# Patient Record
Sex: Female | Born: 1995 | Race: White | Hispanic: No | Smoking: Never smoker
Health system: Southern US, Community
[De-identification: ages and names within clinical notes are randomized; demographics above are authoritative.]

## PROBLEM LIST (undated history)

## (undated) DIAGNOSIS — F419 Anxiety disorder, unspecified: Secondary | ICD-10-CM

---
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
## 2018-10-13 ENCOUNTER — Ambulatory Visit: Payer: Self-pay | Admitting: Family Medicine

## 2018-11-09 NOTE — L&D Delivery Note (Signed)
       Delivery Note   Wendy Castro is a 23 y.o. G2P1001 at [redacted]w[redacted]d Estimated Date of Delivery: 11/19/19  PRE-OPERATIVE DIAGNOSIS:  1) [redacted]w[redacted]d pregnancy.   POST-OPERATIVE DIAGNOSIS:  1) [redacted]w[redacted]d pregnancy s/p Vaginal, Spontaneous   Delivery Type: Vaginal, Spontaneous    Delivery Anesthesia: Epidural   Labor Complications:  none    ESTIMATED BLOOD LOSS: 300 ml    FINDINGS:   1) female infant, Apgar scores of 9   at 1 minute and 10   at 5 minutes  Birth weight pending , baby remains skin to skin.   2) Nuchal cord: no  SPECIMENS:   PLACENTA:   Appearance: Intact , 3 vessel cord   Removal: Spontaneous      Disposition:  held per protocol then discarded  DISPOSITION:  Infant to left in stable condition in the delivery room, with L&D personnel and mother,  NARRATIVE SUMMARY: Labor course:  [name] is a G2P1001 at [redacted]w[redacted]d who presented for observation after a fall , was found to be contraction and dilated to 6 cm.   She progressed well in labor without pitocin.  She received the appropriate epidural anesthesia and proceeded to complete dilation. She evidenced good maternal expulsive effort during the second stage. She went on to deliver a viable female infant. The placenta delivered without problems and was noted to be complete. A perineal and vaginal examination was performed. Episiotomy/Lacerations:  none. The patient tolerated this well.  Philip Aspen, CNM  11/08/2019 11:38 PM

## 2019-03-20 ENCOUNTER — Telehealth: Payer: Self-pay | Admitting: *Deleted

## 2019-03-20 NOTE — Telephone Encounter (Signed)
Coronavirus (COVID-19) Are you at risk?  Are you at risk for the Coronavirus (COVID-19)?  To be considered HIGH RISK for Coronavirus (COVID-19), you have to meet the following criteria:  . Traveled to [place], [place], [place], [place] or [place]; or in the [place] to [place], [place], [place], or [place]; and have fever, cough, and shortness of breath within the last 2 weeks of travel OR . Been in close contact with a person diagnosed with COVID-19 within the last 2 weeks and have fever, cough, and shortness of breath . IF YOU DO NOT MEET THESE CRITERIA, YOU ARE CONSIDERED LOW RISK FOR COVID-19.  What to do if you are HIGH RISK for COVID-19?  . If you are having a medical emergency, call 911. . Seek medical care right away. Before you go to a doctor's office, urgent care or emergency department, call ahead and tell them about your recent travel, contact with someone diagnosed with COVID-19, and your symptoms. You should receive instructions from your physician's office regarding next steps of care.  . When you arrive at healthcare provider, tell the healthcare staff immediately you have returned from visiting [place], [place], [place], [place] or [place]; or traveled in the [place] to [place], [place], [place], or [place]; in the last two weeks or you have been in close contact with a person diagnosed with COVID-19 in the last 2 weeks.   . Tell the health care staff about your symptoms: fever, cough and shortness of breath. . After you have been seen by a medical provider, you will be either: o Tested for (COVID-19) and discharged home on quarantine except to seek medical care if symptoms worsen, and asked to  - Stay home and avoid contact with others until you get your results (4-5 days)  - Avoid travel on public transportation if possible (such as bus, train, or airplane) or o Sent to the Emergency Department by EMS for evaluation, COVID-19 testing, and possible  admission depending on your condition and test results.  What to do if you are LOW RISK for COVID-19?  Reduce your risk of any infection by using the same precautions used for avoiding the common cold or flu:  . Wash your hands often with soap and warm water for at least 20 seconds.  If soap and water are not readily available, use an alcohol-based hand sanitizer with at least 60% alcohol.  . If coughing or sneezing, cover your mouth and nose by coughing or sneezing into the elbow areas of your shirt or coat, into a tissue or into your sleeve (not your hands). . Avoid shaking hands with others and consider head nods or verbal greetings only. . Avoid touching your eyes, nose, or mouth with unwashed hands.  . Avoid close contact with people who are sick. . Avoid places or events with large numbers of people in one location, like concerts or sporting events. . Carefully consider travel plans you have or are making. . If you are planning any travel outside or inside the [place], visit the CDC's Travelers' Health webpage for the latest health notices. . If you have some symptoms but not all symptoms, continue to monitor at home and seek medical attention if your symptoms worsen. . If you are having a medical emergency, call 911.   ADDITIONAL HEALTHCARE OPTIONS FOR PATIENTS  [facility] / e-Visit: [email or web address]         [facility]: [phone]  [facility]: [phone]                   [facility]: [phone]   Spoke with pt denies any sx.  Shatana Saxton, CMA 

## 2019-03-21 ENCOUNTER — Other Ambulatory Visit: Payer: Self-pay

## 2019-03-21 ENCOUNTER — Encounter: Payer: Self-pay | Admitting: Certified Nurse Midwife

## 2019-03-21 ENCOUNTER — Ambulatory Visit (INDEPENDENT_AMBULATORY_CARE_PROVIDER_SITE_OTHER): Payer: 59 | Admitting: Certified Nurse Midwife

## 2019-03-21 VITALS — BP 107/69 | HR 87 | Ht 64.0 in | Wt 128.0 lb

## 2019-03-21 DIAGNOSIS — Z3201 Encounter for pregnancy test, result positive: Secondary | ICD-10-CM

## 2019-03-21 DIAGNOSIS — Z3687 Encounter for antenatal screening for uncertain dates: Secondary | ICD-10-CM

## 2019-03-21 DIAGNOSIS — N926 Irregular menstruation, unspecified: Secondary | ICD-10-CM

## 2019-03-21 DIAGNOSIS — N912 Amenorrhea, unspecified: Secondary | ICD-10-CM | POA: Diagnosis not present

## 2019-03-21 LAB — POCT URINE PREGNANCY: Preg Test, Ur: POSITIVE — AB

## 2019-03-21 NOTE — Progress Notes (Signed)
GYN ENCOUNTER NOTE  Subjective:       Wendy Castro is a 23 y.o. 102P1001 female here for pregnancy confirmation.   Reports missed menses, nipple sensitivity, nausea without vomiting and intermittent abdominal cramping. Still breastfeeding eldest child.   Denies difficulty breathing or respiratory distress, chest pain, abdominal pain, vaginal bleeding, dysuria, and leg pain or swelling.    Gynecologic History  Patient's last menstrual period was 02/14/2019 (exact date).  Gestational age: 93 weeks 0 days  Estimated date of birth: 11/21/2019  Contraception: none   Obstetric History  OB History  Gravida Para Term Preterm AB Living  2 1 1     1   SAB TAB Ectopic Multiple Live Births          1    # Outcome Date GA Lbr Len/2nd Weight Sex Delivery Anes PTL Lv  2 Current           1 Term 07/31/17   8 lb 9 oz (3.884 kg) M    LIV    No past medical history on file.  Allergies  Allergen Reactions  . Amoxicillin Rash    Social History   Socioeconomic History  . Marital status: Married    Spouse name: Not on file  . Number of children: Not on file  . Years of education: Not on file  . Highest education level: Not on file  Occupational History  . Not on file  Social Needs  . Financial resource strain: Not on file  . Food insecurity:    Worry: Not on file    Inability: Not on file  . Transportation needs:    Medical: Not on file    Non-medical: Not on file  Tobacco Use  . Smoking status: Never Smoker  . Smokeless tobacco: Never Used  Substance and Sexual Activity  . Alcohol use: Not Currently  . Drug use: Never  . Sexual activity: Yes    Birth control/protection: None  Lifestyle  . Physical activity:    Days per week: Not on file    Minutes per session: Not on file  . Stress: Not on file  Relationships  . Social connections:    Talks on phone: Not on file    Gets together: Not on file    Attends religious service: Not on file    Active member of club or  organization: Not on file    Attends meetings of clubs or organizations: Not on file    Relationship status: Not on file  . Intimate partner violence:    Fear of current or ex partner: Not on file    Emotionally abused: Not on file    Physically abused: Not on file    Forced sexual activity: Not on file  Other Topics Concern  . Not on file  Social History Narrative  . Not on file    Family History  Problem Relation Age of Onset  . Lupus Paternal Aunt   . Breast cancer Other   . Ovarian cancer Neg Hx   . Colon cancer Neg Hx     The following portions of the patient's history were reviewed and updated as appropriate: allergies, current medications, past family history, past medical history, past social history, past surgical history and problem list.  Review of Systems  ROS negative except as noted above. Information obtained from patient.   Objective:   BP 107/69   Pulse 87   Ht 5\' 4"  (1.626 m)   Wt 128 lb (  58.1 kg)   LMP 02/14/2019 (Exact Date)   BMI 21.97 kg/m    CONSTITUTIONAL: Well-developed, well-nourished female in no acute distress.   PHYSICAL EXAM: Not indicated.   Recent Results (from the past 2160 hour(s))  POCT urine pregnancy     Status: Abnormal   Collection Time: 03/21/19  9:23 AM  Result Value Ref Range   Preg Test, Ur Positive (A) Negative    Assessment:   1. Amenorrhea  - POCT urine pregnancy  2. Positive pregnancy test  - Beta HCG, Quant - US OB LESS THAN 14 WEEKS WITH OB TRANSVAGINAL; Future  3. Unsure of LMP (last menstrual period) as reason for ultrasound scan  - Beta HCG, Quant - US OB LESS THAN 14 WEEKS WITH OB TRANSVAGINAL; Future  4. Lactating mother  - Beta HCG, Quant - US OB LESS THAN 14 WEEKS WITH OB TRANSVAGINAL; Future  5. Irregular menses  - Beta HCG, Quant - US OB LESS THAN 14 WEEKS WITH OB TRANSVAGINAL; Future   Plan:   Labs: Beta, see orders.   First trimester education, see AVS.   Reviewed red flag  symptoms and when to call.   RTC x 2-3 weeks for dating/viability ultrasound and Nurse intake.   RTC x 7 weeks for NOB Physical or sooner if needed.    Gunnar Bulla, CNM [facility], [facility] 03/21/19 4:15 PM

## 2019-03-21 NOTE — Patient Instructions (Signed)
Breastfeeding During Pregnancy Deciding whether to continue breastfeeding during a pregnancy is an individual choice. Breastfeeding during pregnancy is generally not risky. Your nursing child may naturally stop breastfeeding (wean) during your pregnancy. If you have problems during pregnancy, you may be advised to stop breastfeeding. Work with your health care provider to help decide if breastfeeding during pregnancy is right for you. What should I consider when deciding whether to breastfeed during pregnancy? When deciding whether to continue breastfeeding while you are pregnant, you may want to consider:  The age of your nursing child and his or her physical and emotional needs.  Any health concerns related to your pregnancy. It may not be safe to continue breastfeeding if you have certain problems, such as: ? Uterine pain or bleeding. ? A history of preterm labor and delivery. ? Problems gaining weight or losing weight during pregnancy. ? A history of cervical insufficiency. This is a condition in which the cervix begins to thin and soften before your due date.  Whether you have any problems associated with breastfeeding. Some common problems experienced when breastfeeding during pregnancy include: ? Nipple tenderness and breast soreness. ? Nausea. ? Discomfort while breastfeeding due to the growing belly. ? Fatigue. ? Reduced milk supply. This may mean having fewer feedings a day. ? Changes in how your milk tastes. ? Uterine contractions. Follow these instructions at home:   Keep an open mind about how your breastfeeding experience will be. Avoid setting rigid expectations for yourself. Your needs and your nursing child's needs are likely to change as your pregnancy progresses.  Make sure that you are gaining a healthy amount of weight and eating enough calories.  Eat a healthy diet that includes fresh fruits and vegetables, whole grains, lean meat, fish, eggs, beans, nuts, and  seeds, and low-fat dairy products.  Drink plenty of fluids so your urine is clear or pale yellow.  Work with your health care provider and your child's health care provider as needed.  Keep track of your nursing child's weight. If your nursing baby is younger than twelve months, the normal decrease of your milk supply that happens during pregnancy could keep your baby from getting all the milk he or she needs.  Keep track of your nursing child's daily wet diapers and bowel movements to make sure he or she is staying hydrated. Your baby should have 6-8 wet diapers and at least 3 stools each day.  Talk to your health care provider or lactation specialist before starting any new medications or supplements. This is to make sure that they are safe for both pregnancy and breastfeeding. Where to find more information  [employer]: http://www.llli.org Contact a health care provider if:  Your breasts become large and painful (engorged).  Your nursing child is urinating or having bowel movements less often than normal. Summary  Breastfeeding during pregnancy is generally not risky. However, if you have problems during pregnancy, you may be advised to stop breastfeeding.  During pregnancy, your milk supply naturally decreases. Your nursing child may wean himself or herself naturally during your pregnancy.  Keep track of your nursing child's weight, wet diapers, and stools to make sure that he or she is getting enough milk.  Keep an open mind about how your breastfeeding experience will be. Avoid setting rigid expectations for yourself. This information is not intended to replace advice given to you by your health care provider. Make sure you discuss any questions you have with your health care provider.  Document Released: 02/23/2005 Document Revised: 10/27/2016 Document Reviewed: 10/27/2016 Elsevier Interactive Patient Education  2019 [employer] Inc[employer] Morning Sickness  Morning  sickness is when you feel sick to your stomach (nauseous) during pregnancy. You may feel sick to your stomach and throw up (vomit). You may feel sick in the morning, but you can feel this way at any time of day. Some women feel very sick to their stomach and cannot stop throwing up (hyperemesis gravidarum). Follow these instructions at home: Medicines  Take over-the-counter and prescription medicines only as told by your doctor. Do not take any medicines until you talk with your doctor about them first.  Taking multivitamins before getting pregnant can stop or lessen the harshness of morning sickness. Eating and drinking  Eat dry toast or crackers before getting out of bed.  Eat 5 or 6 small meals a day.  Eat dry and bland foods like rice and baked potatoes.  Do not eat greasy, fatty, or spicy foods.  Have someone cook for you if the smell of food causes you to feel sick or throw up.  If you feel sick to your stomach after taking prenatal vitamins, take them at night or with a snack.  Eat protein when you need a snack. Nuts, yogurt, and cheese are good choices.  Drink fluids throughout the day.  Try ginger ale made with real ginger, ginger tea made from fresh grated ginger, or ginger candies. General instructions  Do not use any products that have nicotine or tobacco in them, such as cigarettes and e-cigarettes. If you need help quitting, ask your doctor.  Use an air purifier to keep the air in your house free of smells.  Get lots of fresh air.  Try to avoid smells that make you feel sick.  Try: ? Wearing a bracelet that is used for seasickness (acupressure wristband). ? Going to a doctor who puts thin needles into certain body points (acupuncture) to improve how you feel. Contact a doctor if:  You need medicine to feel better.  You feel dizzy or light-headed.  You are losing weight. Get help right away if:  You feel very sick to your stomach and cannot stop throwing  up.  You pass out (faint).  You have very bad pain in your belly. Summary  Morning sickness is when you feel sick to your stomach (nauseous) during pregnancy.  You may feel sick in the morning, but you can feel this way at any time of day.  Making some changes to what you eat may help your symptoms go away. This information is not intended to replace advice given to you by your health care provider. Make sure you discuss any questions you have with your health care provider. Document Released: 12/03/2004 Document Revised: 11/26/2016 Document Reviewed: 11/26/2016 Elsevier Interactive Patient Education  2019 [employer]. First Trimester of Pregnancy  The first trimester of pregnancy is from week 1 until the end of week 13 (months 1 through 3). During this time, your baby will begin to develop inside you. At 6-8 weeks, the eyes and face are formed, and the heartbeat can be seen on ultrasound. At the end of 12 weeks, all the baby's organs are formed. Prenatal care is all the medical care you receive before the birth of your baby. Make sure you get good prenatal care and follow all of your doctor's instructions. Follow these instructions at home: Medicines  Take over-the-counter and prescription medicines only as told by your doctor. Some medicines are  safe and some medicines are not safe during pregnancy.  Take a prenatal vitamin that contains at least 600 micrograms (mcg) of folic acid.  If you have trouble pooping (constipation), take medicine that will make your stool soft (stool softener) if your doctor approves. Eating and drinking   Eat regular, healthy meals.  Your doctor will tell you the amount of weight gain that is right for you.  Avoid raw meat and uncooked cheese.  If you feel sick to your stomach (nauseous) or throw up (vomit): ? Eat 4 or 5 small meals a day instead of 3 large meals. ? Try eating a few soda crackers. ? Drink liquids between meals instead of during  meals.  To prevent constipation: ? Eat foods that are high in fiber, like fresh fruits and vegetables, whole grains, and beans. ? Drink enough fluids to keep your pee (urine) clear or pale yellow. Activity  Exercise only as told by your doctor. Stop exercising if you have cramps or pain in your lower belly (abdomen) or low back.  Do not exercise if it is too hot, too humid, or if you are in a place of great height (high altitude).  Try to avoid standing for long periods of time. Move your legs often if you must stand in one place for a long time.  Avoid heavy lifting.  Wear low-heeled shoes. Sit and stand up straight.  You can have sex unless your doctor tells you not to. Relieving pain and discomfort  Wear a good support bra if your breasts are sore.  Take warm water baths (sitz baths) to soothe pain or discomfort caused by hemorrhoids. Use hemorrhoid cream if your doctor says it is okay.  Rest with your legs raised if you have leg cramps or low back pain.  If you have puffy, bulging veins (varicose veins) in your legs: ? Wear support hose or compression stockings as told by your doctor. ? Raise (elevate) your feet for 15 minutes, 3-4 times a day. ? Limit salt in your food. Prenatal care  Schedule your prenatal visits by the twelfth week of pregnancy.  Write down your questions. Take them to your prenatal visits.  Keep all your prenatal visits as told by your doctor. This is important. Safety  Wear your seat belt at all times when driving.  Make a list of emergency phone numbers. The list should include numbers for family, friends, the hospital, and police and fire departments. General instructions  Ask your doctor for a referral to a local prenatal class. Begin classes no later than at the start of month 6 of your pregnancy.  Ask for help if you need counseling or if you need help with nutrition. Your doctor can give you advice or tell you where to go for help.  Do  not use hot tubs, steam rooms, or saunas.  Do not douche or use tampons or scented sanitary pads.  Do not cross your legs for long periods of time.  Avoid all herbs and alcohol. Avoid drugs that are not approved by your doctor.  Do not use any tobacco products, including cigarettes, chewing tobacco, and electronic cigarettes. If you need help quitting, ask your doctor. You may get counseling or other support to help you quit.  Avoid cat litter boxes and soil used by cats. These carry germs that can cause birth defects in the baby and can cause a loss of your baby (miscarriage) or stillbirth.  Visit your dentist. At home, brush your  teeth with a soft toothbrush. Be gentle when you floss. Contact a doctor if:  You are dizzy.  You have mild cramps or pressure in your lower belly.  You have a nagging pain in your belly area.  You continue to feel sick to your stomach, you throw up, or you have watery poop (diarrhea).  You have a bad smelling fluid coming from your vagina.  You have pain when you pee (urinate).  You have increased puffiness (swelling) in your face, hands, legs, or ankles. Get help right away if:  You have a fever.  You are leaking fluid from your vagina.  You have spotting or bleeding from your vagina.  You have very bad belly cramping or pain.  You gain or lose weight rapidly.  You throw up blood. It may look like coffee grounds.  You are around people who have [place] measles, fifth disease, or chickenpox.  You have a very bad headache.  You have shortness of breath.  You have any kind of trauma, such as from a fall or a car accident. Summary  The first trimester of pregnancy is from week 1 until the end of week 13 (months 1 through 3).  To take care of yourself and your unborn baby, you will need to eat healthy meals, take medicines only if your doctor tells you to do so, and do activities that are safe for you and your baby.  Keep all follow-up  visits as told by your doctor. This is important as your doctor will have to ensure that your baby is healthy and growing well. This information is not intended to replace advice given to you by your health care provider. Make sure you discuss any questions you have with your health care provider. Document Released: 04/13/2008 Document Revised: 11/03/2016 Document Reviewed: 11/03/2016 Elsevier Interactive Patient Education  2019 [employer]. How a Baby Grows During Pregnancy  Pregnancy begins when a female's sperm enters a female's egg (fertilization). Fertilization usually happens in one of the tubes (fallopian tubes) that connect the ovaries to the womb (uterus). The fertilized egg moves down the fallopian tube to the uterus. Once it reaches the uterus, it implants into the lining of the uterus and begins to grow. For the first 10 weeks, the fertilized egg is called an embryo. After 10 weeks, it is called a fetus. As the fetus continues to grow, it receives oxygen and nutrients through tissue (placenta) that grows to support the developing baby. The placenta is the life support system for the baby. It provides oxygen and nutrition and removes waste. Learning as much as you can about your pregnancy and how your baby is developing can help you enjoy the experience. It can also make you aware of when there might be a problem and when to ask questions. How long does a typical pregnancy last? A pregnancy usually lasts 280 days, or about 40 weeks. Pregnancy is divided into three periods of growth, also called trimesters:  First trimester: 0-12 weeks.  Second trimester: 13-27 weeks.  Third trimester: 28-40 weeks. The day when your baby is ready to be born (full term) is your estimated date of delivery. How does my baby develop month by month? First month  The fertilized egg attaches to the inside of the uterus.  Some cells will form the placenta. Others will form the fetus.  The arms, legs,  brain, spinal cord, lungs, and heart begin to develop.  At the end of the first month, the heart  begins to beat. Second month  The bones, inner ear, eyelids, hands, and feet form.  The genitals develop.  By the end of 8 weeks, all major organs are developing. Third month  All of the internal organs are forming.  Teeth develop below the gums.  Bones and muscles begin to grow. The spine can flex.  The skin is transparent.  Fingernails and toenails begin to form.  Arms and legs continue to grow longer, and hands and feet develop.  The fetus is about 3 inches (7.6 cm) long. Fourth month  The placenta is completely formed.  The external sex organs, neck, outer ear, eyebrows, eyelids, and fingernails are formed.  The fetus can hear, swallow, and move its arms and legs.  The kidneys begin to produce urine.  The skin is covered with a white, waxy coating (vernix) and very fine hair (lanugo). Fifth month  The fetus moves around more and can be felt for the first time (quickening).  The fetus starts to sleep and wake up and may begin to suck its finger.  The nails grow to the end of the fingers.  The organ in the digestive system that makes bile (gallbladder) functions and helps to digest nutrients.  If your baby is a girl, eggs are present in her ovaries. If your baby is a boy, testicles start to move down into his scrotum. Sixth month  The lungs are formed.  The eyes open. The brain continues to develop.  Your baby has fingerprints and toe prints. Your baby's hair grows thicker.  At the end of the second trimester, the fetus is about 9 inches (22.9 cm) long. Seventh month  The fetus kicks and stretches.  The eyes are developed enough to sense changes in light.  The hands can make a grasping motion.  The fetus responds to sound. Eighth month  All organs and body systems are fully developed and functioning.  Bones harden, and taste buds develop. The fetus  may hiccup.  Certain areas of the brain are still developing. The skull remains soft. Ninth month  The fetus gains about  lb (0.23 kg) each week.  The lungs are fully developed.  Patterns of sleep develop.  The fetus's head typically moves into a head-down position (vertex) in the uterus to prepare for birth.  The fetus weighs 6-9 lb (2.72-4.08 kg) and is 19-20 inches (48.26-50.8 cm) long. What can I do to have a healthy pregnancy and help my baby develop? General instructions  Take prenatal vitamins as directed by your health care provider. These include vitamins such as folic acid, iron, calcium, and vitamin D. They are important for healthy development.  Take medicines only as directed by your health care provider. Read labels and ask a pharmacist or your health care provider whether over-the-counter medicines, supplements, and prescription drugs are safe to take during pregnancy.  Keep all follow-up visits as directed by your health care provider. This is important. Follow-up visits include prenatal care and screening tests. How do I know if my baby is developing well? At each prenatal visit, your health care provider will do several different tests to check on your health and keep track of your baby's development. These include:  Fundal height and position. ? Your health care provider will measure your growing belly from your pubic bone to the top of the uterus using a tape measure. ? Your health care provider will also feel your belly to determine your baby's position.  Heartbeat. ? An ultrasound  in the first trimester can confirm pregnancy and show a heartbeat, depending on how far along you are. ? Your health care provider will check your baby's heart rate at every prenatal visit.  Second trimester ultrasound. ? This ultrasound checks your baby's development. It also may show your baby's gender. What should I do if I have concerns about my baby's development? Always talk  with your health care provider about any concerns that you may have about your pregnancy and your baby. Summary  A pregnancy usually lasts 280 days, or about 40 weeks. Pregnancy is divided into three periods of growth, also called trimesters.  Your health care provider will monitor your baby's growth and development throughout your pregnancy.  Follow your health care provider's recommendations about taking prenatal vitamins and medicines during your pregnancy.  Talk with your health care provider if you have any concerns about your pregnancy or your developing baby. This information is not intended to replace advice given to you by your health care provider. Make sure you discuss any questions you have with your health care provider. Document Released: 04/13/2008 Document Revised: 09/08/2017 Document Reviewed: 09/08/2017 Elsevier Interactive Patient Education  2019 [employer].

## 2019-03-21 NOTE — Progress Notes (Signed)
Patient here for pregnancy confirmation, currently breast-feeding.  Sure of LMP but menses are irregular.

## 2019-04-06 ENCOUNTER — Ambulatory Visit (INDEPENDENT_AMBULATORY_CARE_PROVIDER_SITE_OTHER): Payer: 59

## 2019-04-06 ENCOUNTER — Ambulatory Visit (INDEPENDENT_AMBULATORY_CARE_PROVIDER_SITE_OTHER): Payer: 59 | Admitting: Certified Nurse Midwife

## 2019-04-06 ENCOUNTER — Other Ambulatory Visit: Payer: Self-pay

## 2019-04-06 VITALS — BP 100/65 | HR 80 | Ht 64.0 in | Wt 127.6 lb

## 2019-04-06 DIAGNOSIS — N926 Irregular menstruation, unspecified: Secondary | ICD-10-CM

## 2019-04-06 DIAGNOSIS — Z3401 Encounter for supervision of normal first pregnancy, first trimester: Secondary | ICD-10-CM

## 2019-04-06 DIAGNOSIS — Z3687 Encounter for antenatal screening for uncertain dates: Secondary | ICD-10-CM | POA: Diagnosis not present

## 2019-04-06 DIAGNOSIS — Z3201 Encounter for pregnancy test, result positive: Secondary | ICD-10-CM

## 2019-04-06 LAB — OB RESULTS CONSOLE VARICELLA ZOSTER ANTIBODY, IGG: Varicella: IMMUNE

## 2019-04-06 NOTE — Progress Notes (Signed)
I have reviewed the record and concur with patient management and plan of care.    Gunnar Bulla, CNM [facility], [facility] 04/06/19 10:56 AM

## 2019-04-06 NOTE — Progress Notes (Signed)
Iline Oven presents for NOB nurse interview visit. Pregnancy confirmation done [facility] 03/21/19.  G- 2.  P- 1 0 0 1  . Pregnancy education material explained and given. 0 cats in the home. NOB labs ordered. PNV encouraged. Genetic screening options discussed. Genetic testing: Ordered/Declined/Unsure.  Pt may discuss with provider. Pt. To follow up with provider in _4_ weeks for NOB physical.  All questions answered. FMLA and financial policy reviewed. Patient will have labs done the day of the next scheduled appointment for CBC and Panorama

## 2019-04-07 LAB — URINALYSIS, ROUTINE W REFLEX MICROSCOPIC
Bilirubin, UA: NEGATIVE
Glucose, UA: NEGATIVE
Ketones, UA: NEGATIVE
Nitrite, UA: NEGATIVE
RBC, UA: NEGATIVE
Specific Gravity, UA: >=1.03 — AB (ref 1.005–1.030)
Urobilinogen, Ur: 0.2 mg/dL (ref 0.2–1.0)
pH, UA: 5.5 (ref 5.0–7.5)

## 2019-04-07 LAB — VARICELLA ZOSTER ANTIBODY, IGG: Varicella zoster IgG: 748 index (ref >165)

## 2019-04-07 LAB — MICROSCOPIC EXAMINATION
Casts: NONE SEEN /lpf
Epithelial Cells (non renal): >10 /hpf — AB (ref 0–10)
RBC, Urine: NONE SEEN /hpf (ref 0–2)

## 2019-04-07 LAB — HEPATITIS B SURFACE ANTIGEN: Hepatitis B Surface Ag: NEGATIVE

## 2019-04-07 LAB — RUBELLA SCREEN: Rubella Antibodies, IGG: 4.8 index (ref >0.99)

## 2019-04-07 LAB — ANTIBODY SCREEN: Antibody Screen: NEGATIVE

## 2019-04-07 LAB — ABO AND RH: Rh Factor: POSITIVE

## 2019-04-07 LAB — RPR: RPR Ser Ql: NONREACTIVE

## 2019-04-08 LAB — URINE CULTURE

## 2019-04-11 LAB — GC/CHLAMYDIA PROBE AMP
Chlamydia trachomatis, NAA: NEGATIVE
Neisseria Gonorrhoeae by PCR: NEGATIVE

## 2019-05-08 ENCOUNTER — Ambulatory Visit (INDEPENDENT_AMBULATORY_CARE_PROVIDER_SITE_OTHER): Payer: 59 | Admitting: Certified Nurse Midwife

## 2019-05-08 ENCOUNTER — Encounter: Payer: Self-pay | Admitting: Certified Nurse Midwife

## 2019-05-08 ENCOUNTER — Other Ambulatory Visit: Payer: Self-pay

## 2019-05-08 VITALS — BP 89/63 | HR 77 | Wt 127.3 lb

## 2019-05-08 DIAGNOSIS — O219 Vomiting of pregnancy, unspecified: Secondary | ICD-10-CM

## 2019-05-08 DIAGNOSIS — Z3492 Encounter for supervision of normal pregnancy, unspecified, second trimester: Secondary | ICD-10-CM

## 2019-05-08 LAB — POCT URINALYSIS DIPSTICK OB
Bilirubin, UA: NEGATIVE
Blood, UA: NEGATIVE
Glucose, UA: NEGATIVE
Ketones, UA: NEGATIVE
Leukocytes, UA: NEGATIVE
Nitrite, UA: NEGATIVE
POC,PROTEIN,UA: NEGATIVE
Spec Grav, UA: 1.02 (ref 1.010–1.025)
Urobilinogen, UA: 0.2 E.U./dL
pH, UA: 7 (ref 5.0–8.0)

## 2019-05-08 NOTE — Progress Notes (Signed)
Patient here for NOB physical, c/o occasional pelvic pressure x3 weeks.

## 2019-05-08 NOTE — Patient Instructions (Addendum)
WHAT OB PATIENTS CAN EXPECT   Confirmation of pregnancy and ultrasound ordered if medically indicated-[redacted] weeks gestation  New OB (NOB) intake with nurse and New OB (NOB) labs- [redacted] weeks gestation  New OB (NOB) physical examination with provider- 11/[redacted] weeks gestation  Flu vaccine-[redacted] weeks gestation  Anatomy scan-[redacted] weeks gestation  Glucose tolerance test, blood work to test for anemia, T-dap vaccine-[redacted] weeks gestation  Vaginal swabs/cultures-STD/Group B strep-[redacted] weeks gestation  Appointments every 4 weeks until 28 weeks  Every 2 weeks from 28 weeks until 36 weeks  Weekly visits from 36 weeks until delivery  Round Ligament Pain  The round ligament is a cord of muscle and tissue that helps support the uterus. It can become a source of pain during pregnancy if it becomes stretched or twisted as the baby grows. The pain usually begins in the second trimester (13-28 weeks) of pregnancy, and it can come and go until the baby is delivered. It is not a serious problem, and it does not cause harm to the baby. Round ligament pain is usually a short, sharp, and pinching pain, but it can also be a dull, lingering, and aching pain. The pain is felt in the lower side of the abdomen or in the groin. It usually starts deep in the groin and moves up to the outside of the hip area. The pain may occur when you:  Suddenly change position, such as quickly going from a sitting to standing position.  Roll over in bed.  Cough or sneeze.  Do physical activity. Follow these instructions at home:   Watch your condition for any changes.  When the pain starts, relax. Then try any of these methods to help with the pain: ? Sitting down. ? Flexing your knees up to your abdomen. ? Lying on your side with one pillow under your abdomen and another pillow between your legs. ? Sitting in a warm bath for 15-20 minutes or until the pain goes away.  Take over-the-counter and prescription medicines only as told by  your health care provider.  Move slowly when you sit down or stand up.  Avoid long walks if they cause pain.  Stop or reduce your physical activities if they cause pain.  Keep all follow-up visits as told by your health care provider. This is important. Contact a health care provider if:  Your pain does not go away with treatment.  You feel pain in your back that you did not have before.  Your medicine is not helping. Get help right away if:  You have a fever or chills.  You develop uterine contractions.  You have vaginal bleeding.  You have nausea or vomiting.  You have diarrhea.  You have pain when you urinate. Summary  Round ligament pain is felt in the lower abdomen or groin. It is usually a short, sharp, and pinching pain. It can also be a dull, lingering, and aching pain.  This pain usually begins in the second trimester (13-28 weeks). It occurs because the uterus is stretching with the growing baby, and it is not harmful to the baby.  You may notice the pain when you suddenly change position, when you cough or sneeze, or during physical activity.  Relaxing, flexing your knees to your abdomen, lying on one side, or taking a warm bath may help to get rid of the pain.  Get help from your health care provider if the pain does not go away or if you have vaginal bleeding, nausea, vomiting,  diarrhea, or painful urination. This information is not intended to replace advice given to you by your health care provider. Make sure you discuss any questions you have with your health care provider. Document Released: 08/04/2008 Document Revised: 04/13/2018 Document Reviewed: 04/13/2018 Elsevier Patient Education  [place]. Back Pain in Pregnancy Back pain during pregnancy is common. Back pain may be caused by several factors that are related to changes during your pregnancy. Follow these instructions at home: Managing pain, stiffness, and swelling      If directed,  for sudden (acute) back pain, put ice on the painful area. ? Put ice in a plastic bag. ? Place a towel between your skin and the bag. ? Leave the ice on for 20 minutes, 2-3 times per day.  If directed, apply heat to the affected area before you exercise. Use the heat source that your health care provider recommends, such as a moist heat pack or a heating pad. ? Place a towel between your skin and the heat source. ? Leave the heat on for 20-30 minutes. ? Remove the heat if your skin turns bright red. This is especially important if you are unable to feel pain, heat, or cold. You may have a greater risk of getting burned.  If directed, massage the affected area. Activity  Exercise as told by your health care provider. Gentle exercise is the best way to prevent or manage back pain.  Listen to your body when lifting. If lifting hurts, ask for help or bend your knees. This uses your leg muscles instead of your back muscles.  Squat down when picking up something from the floor. Do not bend over.  Only use bed rest for short periods as told by your health care provider. Bed rest should only be used for the most severe episodes of back pain. Standing, sitting, and lying down  Do not stand in one place for long periods of time.  Use good posture when sitting. Make sure your head rests over your shoulders and is not hanging forward. Use a pillow on your lower back if necessary.  Try sleeping on your side, preferably the left side, with a pregnancy support pillow or 1-2 regular pillows between your legs. ? If you have back pain after a night's rest, your bed may be too soft. ? A firm mattress may provide more support for your back during pregnancy. General instructions  Do not wear high heels.  Eat a healthy diet. Try to gain weight within your health care provider's recommendations.  Use a maternity girdle, elastic sling, or back brace as told by your health care provider.  Take  over-the-counter and prescription medicines only as told by your health care provider.  Work with a physical therapist or massage therapist to find ways to manage back pain. Acupuncture or massage therapy may be helpful.  Keep all follow-up visits as told by your health care provider. This is important. Contact a health care provider if:  Your back pain interferes with your daily activities.  You have increasing pain in other parts of your body. Get help right away if:  You develop numbness, tingling, weakness, or problems with the use of your arms or legs.  You develop severe back pain that is not controlled with medicine.  You have a change in bowel or bladder control.  You develop shortness of breath, dizziness, or you faint.  You develop nausea, vomiting, or sweating.  You have back pain that is a rhythmic,  cramping pain similar to labor pains. Labor pain is usually 1-2 minutes apart, lasts for about 1 minute, and involves a bearing down feeling or pressure in your pelvis.  You have back pain and your water breaks or you have vaginal bleeding.  You have back pain or numbness that travels down your leg.  Your back pain developed after you fell.  You develop pain on one side of your back.  You see blood in your urine.  You develop skin blisters in the area of your back pain. Summary  Back pain may be caused by several factors that are related to changes during your pregnancy.  Follow instructions as told by your health care provider for managing pain, stiffness, and swelling.  Exercise as told by your health care provider. Gentle exercise is the best way to prevent or manage back pain.  Take over-the-counter and prescription medicines only as told by your health care provider.  Keep all follow-up visits as told by your health care provider. This is important. This information is not intended to replace advice given to you by your health care provider. Make sure you  discuss any questions you have with your health care provider. Document Released: 02/03/2006 Document Revised: 02/14/2019 Document Reviewed: 04/13/2018 Elsevier Patient Education  [place]. Common Medications Safe in Pregnancy  Acne:      Constipation:  Benzoyl Peroxide     Colace  Clindamycin      Dulcolax Suppository  Topica Erythromycin     Fibercon  Salicylic Acid      Metamucil         Miralax AVOID:        Senakot   Accutane    Cough:  Retin-A       Cough Drops  Tetracycline      Phenergan w/ Codeine if Rx  Minocycline      Robitussin (Plain & DM)  Antibiotics:     Crabs/Lice:  Ceclor       RID  Cephalosporins    AVOID:  E-Mycins      Kwell  Keflex  Macrobid/Macrodantin   Diarrhea:  Penicillin      Kao-Pectate  Zithromax      Imodium AD         PUSH FLUIDS AVOID:       Cipro     Fever:  Tetracycline      Tylenol (Regular or Extra  Minocycline       Strength)  Levaquin      Extra Strength-Do not          Exceed 8 tabs/24 hrs Caffeine:        <273m/day (equiv. To 1 cup of coffee or  approx. 3 12 oz sodas)         Gas: Cold/Hayfever:       Gas-X  Benadryl      Mylicon  Claritin       Phazyme  **Claritin-D        Chlor-Trimeton    Headaches:  Dimetapp      ASA-Free Excedrin  Drixoral-Non-Drowsy     Cold Compress  Mucinex (Guaifenasin)     Tylenol (Regular or Extra  Sudafed/Sudafed-12 Hour     Strength)  **Sudafed PE Pseudoephedrine   Tylenol Cold & Sinus     Vicks Vapor Rub  Zyrtec  **AVOID if Problems With Blood Pressure         Heartburn: Avoid lying down for at least 1 hour after meals  Aciphex  Maalox     Rash:  Milk of Magnesia     Benadryl    Mylanta       1% Hydrocortisone Cream  Pepcid  Pepcid Complete   Sleep Aids:  Prevacid      Ambien   Prilosec       Benadryl  Rolaids       Chamomile Tea  Tums (Limit 4/day)     Unisom  Zantac       Tylenol PM         Warm milk-add vanilla or  Hemorrhoids:       Sugar for taste   Anusol/Anusol H.C.  (RX: Analapram 2.5%)  Sugar Substitutes:  Hydrocortisone OTC     Ok in moderation  Preparation H      Tucks        Vaseline lotion applied to tissue with wiping    Herpes:     Throat:  Acyclovir      Oragel  Famvir  Valtrex     Vaccines:         Flu Shot Leg Cramps:       *Gardasil  Benadryl      Hepatitis A         Hepatitis B Nasal Spray:       Pneumovax  Saline Nasal Spray     Polio Booster         Tetanus Nausea:       Tuberculosis test or PPD  Vitamin B6 25 mg TID   AVOID:    Dramamine      *Gardasil  Emetrol       Live Poliovirus  Ginger Root 250 mg QID    MMR (measles, mumps &  High Complex Carbs @ Bedtime    rebella)  Sea Bands-Accupressure    Varicella (Chickenpox)  Unisom 1/2 tab TID     *No known complications           If received before Pain:         Known pregnancy;   Darvocet       Resume series after  Lortab        Delivery  Percocet    Yeast:   Tramadol      Femstat  Tylenol 3      Gyne-lotrimin  Ultram       Monistat  Vicodin           MISC:         All Sunscreens           Hair Coloring/highlights          Insect Repellant's          (Including DEET)         [place] Eating Plan for Pregnant Women While you are pregnant, your body requires additional nutrition to help support your growing baby. You also have a higher need for some vitamins and minerals, such as folic acid, calcium, iron, and vitamin D. Eating a healthy, well-balanced diet is very important for your health and your baby's health. Your need for extra calories varies for the three 2-monthsegments of your pregnancy (trimesters). For most women, it is recommended to consume:  150 extra calories a day during the first trimester.  300 extra calories a day during the second trimester.  300 extra calories a day during the third trimester. What are tips for following this plan?   Do not try to lose weight or go on a diet  during pregnancy.  Limit your overall  intake of foods that have "empty calories." These are foods that have little nutritional value, such as sweets, desserts, candies, and sugar-sweetened beverages.  Eat a variety of foods (especially fruits and vegetables) to get a full range of vitamins and minerals.  Take a prenatal vitamin to help meet your additional vitamin and mineral needs during pregnancy, specifically for folic acid, iron, calcium, and vitamin D.  Remember to stay active. Ask your health care provider what types of exercise and activities are safe for you.  Practice good food safety and cleanliness. Wash your hands before you eat and after you prepare raw meat. Wash all fruits and vegetables well before peeling or eating. Taking these actions can help to prevent food-borne illnesses that can be very dangerous to your baby, such as listeriosis. Ask your health care provider for more information about listeriosis. What does 150 extra calories look like? Healthy options that provide 150 extra calories each day could be any of the following:  6-8 oz (170-230 g) of plain low-fat yogurt with  cup of berries.  1 apple with 2 teaspoons (11 g) of peanut butter.  Cut-up vegetables with  cup (60 g) of hummus.  8 oz (230 mL) or 1 cup of low-fat chocolate milk.  1 stick of string cheese with 1 medium orange.  1 peanut butter and jelly sandwich that is made with one slice of whole-wheat bread and 1 tsp (5 g) of peanut butter. For 300 extra calories, you could eat two of those healthy options each day. What is a healthy amount of weight to gain? The right amount of weight gain for you is based on your BMI before you became pregnant. If your BMI:  Was less than 18 (underweight), you should gain 28-40 lb (13-18 kg).  Was 18-24.9 (normal), you should gain 25-35 lb (11-16 kg).  Was 25-29.9 (overweight), you should gain 15-25 lb (7-11 kg).  Was 30 or greater (obese), you should gain 11-20 lb (5-9 kg). What if I am having twins  or multiples? Generally, if you are carrying twins or multiples:  You may need to eat 300-600 extra calories a day.  The recommended range for total weight gain is 25-54 lb (11-25 kg), depending on your BMI before pregnancy.  Talk with your health care provider to find out about nutritional needs, weight gain, and exercise that is right for you. What foods can I eat?  Grains All grains. Choose whole grains, such as whole-wheat bread, oatmeal, or brown rice. Vegetables All vegetables. Eat a variety of colors and types of vegetables. Remember to wash your vegetables well before peeling or eating. Fruits All fruits. Eat a variety of colors and types of fruit. Remember to wash your fruits well before peeling or eating. Meats and other protein foods Lean meats, including chicken, [place], fish, and lean cuts of beef, veal, or pork. If you eat fish or seafood, choose options that are higher in omega-3 fatty acids and lower in mercury, such as salmon, herring, mussels, trout, sardines, pollock, shrimp, crab, and lobster. Tofu. Tempeh. Beans. Eggs. Peanut butter and other nut butters. Make sure that all meats, poultry, and eggs are cooked to food-safe temperatures or "well-done." Two or more servings of fish are recommended each week in order to get the most benefits from omega-3 fatty acids that are found in seafood. Choose fish that are lower in mercury. You can find more information online:  [employer]ch Dairy Pasteurized milk and  milk alternatives (such as almond milk). Pasteurized yogurt and pasteurized cheese. Cottage cheese. Sour cream. Beverages Water. Juices that contain 100% fruit juice or vegetable juice. Caffeine-free teas and decaffeinated coffee. Drinks that contain caffeine are okay to drink, but it is better to avoid caffeine. Keep your total caffeine intake to less than 200 mg each day (which is 12 oz or 355 mL of coffee, tea, or soda) or the limit as told by your health care  provider. Fats and oils Fats and oils are okay to include in moderation. Sweets and desserts Sweets and desserts are okay to include in moderation. Seasoning and other foods All pasteurized condiments. The items listed above may not be a complete list of recommended foods and beverages. Contact your dietitian for more options. The items listed above may not be a complete list of foods and beverages [you/your child] can eat. Contact a dietitian for more information. What foods are not recommended? Vegetables Raw (unpasteurized) vegetable juices. Fruits Unpasteurized fruit juices. Meats and other protein foods Lunch meats, bologna, hot dogs, or other deli meats. (If you must eat those meats, reheat them until they are steaming hot.) Refrigerated pat, meat spreads from a meat counter, smoked seafood that is found in the refrigerated section of a store. Raw or undercooked meats, poultry, and eggs. Raw fish, such as sushi or sashimi. Fish that have high mercury content, such as tilefish, shark, swordfish, and king mackerel. To learn more about mercury in fish, talk with your health care provider or look for online resources, such as:  [employer] Dairy Raw (unpasteurized) milk and any foods that have raw milk in them. Soft cheeses, such as feta, queso blanco, queso fresco, Brie, Camembert cheeses, blue-veined cheeses, and Panela cheese (unless it is made with pasteurized milk, which must be stated on the label). Beverages Alcohol. Sugar-sweetened beverages, such as sodas, teas, or energy drinks. Seasoning and other foods Homemade fermented foods and drinks, such as pickles, sauerkraut, or kombucha drinks. (Store-bought pasteurized versions of these are okay.) Salads that are made in a store or deli, such as ham salad, chicken salad, egg salad, tuna salad, and seafood salad. The items listed above may not be a complete list of foods and beverages to avoid. Contact your dietitian for more  information. The items listed above may not be a complete list of foods and beverages [you/your child] should avoid. Contact a dietitian for more information. Where to find more information To calculate the number of calories you need based on your height, weight, and activity level, you can use an online calculator such as:  MobileTransition.ch To calculate how much weight you should gain during pregnancy, you can use an online pregnancy weight gain calculator such as:  StreamingFood.com.cy Summary  While you are pregnant, your body requires additional nutrition to help support your growing baby.  Eat a variety of foods, especially fruits and vegetables to get a full range of vitamins and minerals.  Practice good food safety and cleanliness. Wash your hands before you eat and after you prepare raw meat. Wash all fruits and vegetables well before peeling or eating. Taking these actions can help to prevent food-borne illnesses, such as listeriosis, that can be very dangerous to your baby.  Do not eat raw meat or fish. Do not eat fish that have high mercury content, such as tilefish, shark, swordfish, and king mackerel. Do not eat unpasteurized (raw) dairy.  Take a prenatal vitamin to help meet your additional vitamin and mineral needs during  pregnancy, specifically for folic acid, iron, calcium, and vitamin D. This information is not intended to replace advice given to you by your health care provider. Make sure you discuss any questions you have with your health care provider. Document Released: 08/10/2014 Document Revised: 02/16/2019 Document Reviewed: 07/23/2017 Elsevier Patient Education  [place] of Pregnancy  The second trimester is from week 14 through week 27 (month 4 through 6). This is often the time in pregnancy that you feel your best. Often times, morning sickness has lessened or quit. You may have more  energy, and you may get hungry more often. Your unborn baby is growing rapidly. At the end of the sixth month, he or she is about 9 inches long and weighs about 1 pounds. You will likely feel the baby move between 18 and 20 weeks of pregnancy. Follow these instructions at home: Medicines  Take over-the-counter and prescription medicines only as told by your doctor. Some medicines are safe and some medicines are not safe during pregnancy.  Take a prenatal vitamin that contains at least 600 micrograms (mcg) of folic acid.  If you have trouble pooping (constipation), take medicine that will make your stool soft (stool softener) if your doctor approves. Eating and drinking   Eat regular, healthy meals.  Avoid raw meat and uncooked cheese.  If you get low calcium from the food you eat, talk to your doctor about taking a daily calcium supplement.  Avoid foods that are high in fat and sugars, such as fried and sweet foods.  If you feel sick to your stomach (nauseous) or throw up (vomit): ? Eat 4 or 5 small meals a day instead of 3 large meals. ? Try eating a few soda crackers. ? Drink liquids between meals instead of during meals.  To prevent constipation: ? Eat foods that are high in fiber, like fresh fruits and vegetables, whole grains, and beans. ? Drink enough fluids to keep your pee (urine) clear or pale yellow. Activity  Exercise only as told by your doctor. Stop exercising if you start to have cramps.  Do not exercise if it is too hot, too humid, or if you are in a place of great height (high altitude).  Avoid heavy lifting.  Wear low-heeled shoes. Sit and stand up straight.  You can continue to have sex unless your doctor tells you not to. Relieving pain and discomfort  Wear a good support bra if your breasts are tender.  Take warm water baths (sitz baths) to soothe pain or discomfort caused by hemorrhoids. Use hemorrhoid cream if your doctor approves.  Rest with your  legs raised if you have leg cramps or low back pain.  If you develop puffy, bulging veins (varicose veins) in your legs: ? Wear support hose or compression stockings as told by your doctor. ? Raise (elevate) your feet for 15 minutes, 3-4 times a day. ? Limit salt in your food. Prenatal care  Write down your questions. Take them to your prenatal visits.  Keep all your prenatal visits as told by your doctor. This is important. Safety  Wear your seat belt when driving.  Make a list of emergency phone numbers, including numbers for family, friends, the hospital, and police and fire departments. General instructions  Ask your doctor about the right foods to eat or for help finding a counselor, if you need these services.  Ask your doctor about local prenatal classes. Begin classes before month 6 of your pregnancy.  Do not use hot tubs, steam rooms, or saunas.  Do not douche or use tampons or scented sanitary pads.  Do not cross your legs for long periods of time.  Visit your dentist if you have not done so. Use a soft toothbrush to brush your teeth. Floss gently.  Avoid all smoking, herbs, and alcohol. Avoid drugs that are not approved by your doctor.  Do not use any products that contain nicotine or tobacco, such as cigarettes and e-cigarettes. If you need help quitting, ask your doctor.  Avoid cat litter boxes and soil used by cats. These carry germs that can cause birth defects in the baby and can cause a loss of your baby (miscarriage) or stillbirth. Contact a doctor if:  You have mild cramps or pressure in your lower belly.  You have pain when you pee (urinate).  You have bad smelling fluid coming from your vagina.  You continue to feel sick to your stomach (nauseous), throw up (vomit), or have watery poop (diarrhea).  You have a nagging pain in your belly area.  You feel dizzy. Get help right away if:  You have a fever.  You are leaking fluid from your vagina.   You have spotting or bleeding from your vagina.  You have severe belly cramping or pain.  You lose or gain weight rapidly.  You have trouble catching your breath and have chest pain.  You notice sudden or extreme puffiness (swelling) of your face, hands, ankles, feet, or legs.  You have not felt the baby move in over an hour.  You have severe headaches that do not go away when you take medicine.  You have trouble seeing. Summary  The second trimester is from week 14 through week 27 (months 4 through 6). This is often the time in pregnancy that you feel your best.  To take care of yourself and your unborn baby, you will need to eat healthy meals, take medicines only if your doctor tells you to do so, and do activities that are safe for you and your baby.  Call your doctor if you get sick or if you notice anything unusual about your pregnancy. Also, call your doctor if you need help with the right food to eat, or if you want to know what activities are safe for you. This information is not intended to replace advice given to you by your health care provider. Make sure you discuss any questions you have with your health care provider. Document Released: 01/20/2010 Document Revised: 02/17/2019 Document Reviewed: 12/01/2016 Elsevier Patient Education  2020 [employer].  Breastfeeding During Pregnancy Deciding whether to continue breastfeeding during a pregnancy is an Designer, television/film set. Breastfeeding during pregnancy is generally not risky. Your nursing child may naturally stop breastfeeding (wean) during your pregnancy. If you have problems during pregnancy, you may be advised to stop breastfeeding. Work with your health care provider to help decide if breastfeeding during pregnancy is right for you. What should I consider when deciding whether to breastfeed during pregnancy? When deciding whether to continue breastfeeding while you are pregnant, you may want to consider:  The age of  your nursing child and his or her physical and emotional needs.  Any health concerns related to your pregnancy. It may not be safe to continue breastfeeding if you have certain problems, such as: ? Uterine pain or bleeding. ? A history of preterm labor and delivery. ? Problems gaining weight or losing weight during pregnancy. ? A history of cervical  insufficiency. This is a condition in which the cervix begins to thin and soften before your due date.  Whether you have any problems associated with breastfeeding. Some common problems experienced when breastfeeding during pregnancy include: ? Nipple tenderness and breast soreness. ? Nausea. ? Discomfort while breastfeeding due to the growing belly. ? Fatigue. ? Reduced milk supply. This may mean having fewer feedings a day. ? Changes in how your milk tastes. ? Uterine contractions. Follow these instructions at home:   Keep an open mind about how your breastfeeding experience will be. Avoid setting rigid expectations for yourself. Your needs and your nursing child's needs are likely to change as your pregnancy progresses.  Make sure that you are gaining a healthy amount of weight and eating enough calories.  Eat a healthy diet that includes fresh fruits and vegetables, whole grains, lean meat, fish, eggs, beans, nuts, and seeds, and low-fat dairy products.  Drink plenty of fluids so your urine is clear or pale yellow.  Work with your health care provider and your child's health care provider as needed.  Keep track of your nursing child's weight. If your nursing baby is younger than twelve months, the normal decrease of your milk supply that happens during pregnancy could keep your baby from getting all the milk he or she needs.  Keep track of your nursing child's daily wet diapers and bowel movements to make sure he or she is staying hydrated. Your baby should have 6-8 wet diapers and at least 3 stools each day.  Talk to your health care  provider or lactation specialist before starting any new medications or supplements. This is to make sure that they are safe for both pregnancy and breastfeeding. Where to find more information  [employer]: http://www.llli.org Contact a health care provider if:  Your breasts become large and painful (engorged).  Your nursing child is urinating or having bowel movements less often than normal. Summary  Breastfeeding during pregnancy is generally not risky. However, if you have problems during pregnancy, you may be advised to stop breastfeeding.  During pregnancy, your milk supply naturally decreases. Your nursing child may wean himself or herself naturally during your pregnancy.  Keep track of your nursing child's weight, wet diapers, and stools to make sure that he or she is getting enough milk.  Keep an open mind about how your breastfeeding experience will be. Avoid setting rigid expectations for yourself. This information is not intended to replace advice given to you by your health care provider. Make sure you discuss any questions you have with your health care provider. Document Released: 02/23/2005 Document Revised: 03/02/2017 Document Reviewed: 10/27/2016 Elsevier Patient Education  2020 [employer].

## 2019-05-08 NOTE — Progress Notes (Signed)
NEW OB HISTORY AND PHYSICAL  SUBJECTIVE:       Wendy Castro is a 23 y.o. [id number] female, Patient's last menstrual period was 02/14/2019 (exact date)., Estimated Date of Delivery: 11/19/19, [redacted]w[redacted]d, presents today for establishment of Prenatal Care.  Reports occasional pelvic pressure and nausea without vomiting.   Denies difficulty breathing or respiratory distress, chest pain, abdominal pain, vaginal bleeding, dysuria, and leg pain or swelling.   Desires midwifery care. Declines genetic screening.    Gynecologic History  Patient's last menstrual period was 02/14/2019 (exact date).  Period Duration (Days): 6 Period Pattern: (!) Irregular Menstrual Flow: Heavy, Light Menstrual Control: Maxi pad Dysmenorrhea: (!) Moderate Dysmenorrhea Symptoms: Cramping  Contraception: none  Last Pap: 2019. Results were: normal per patient   Obstetric History  OB History  Gravida Para Term Preterm AB Living  2 1 1     1   SAB TAB Ectopic Multiple Live Births          1    # Outcome Date GA Lbr Len/2nd Weight Sex Delivery Anes PTL Lv  2 Current           1 Term 07/31/17   8 lb 9 oz (3.884 kg) M Vag-Spont EPI N LIV    No past medical history on file.  Past Surgical History:  Procedure Laterality Date  . WISDOM TOOTH EXTRACTION  2014    Current Outpatient Medications on File Prior to Visit  Medication Sig Dispense Refill  . Prenatal Vit-Fe Fumarate-FA (MULTIVITAMIN-PRENATAL) 27-0.8 MG TABS tablet Take 1 tablet by mouth daily at 12 noon.     No current facility-administered medications on file prior to visit.     Allergies  Allergen Reactions  . Amoxicillin Rash    Social History   Socioeconomic History  . Marital status: Married    Spouse name: Not on file  . Number of children: Not on file  . Years of education: Not on file  . Highest education level: Not on file  Occupational History  . Not on file  Social Needs  . Financial resource strain: Not on file  . Food  insecurity    Worry: Not on file    Inability: Not on file  . Transportation needs    Medical: Not on file    Non-medical: Not on file  Tobacco Use  . Smoking status: Never Smoker  . Smokeless tobacco: Never Used  Substance and Sexual Activity  . Alcohol use: Not Currently  . Drug use: Never  . Sexual activity: Yes    Birth control/protection: None  Lifestyle  . Physical activity    Days per week: Not on file    Minutes per session: Not on file  . Stress: Not on file  Relationships  . Social Herbalist on phone: Not on file    Gets together: Not on file    Attends religious service: Not on file    Active member of club or organization: Not on file    Attends meetings of clubs or organizations: Not on file    Relationship status: Not on file  . Intimate partner violence    Fear of current or ex partner: Not on file    Emotionally abused: Not on file    Physically abused: Not on file    Forced sexual activity: Not on file  Other Topics Concern  . Not on file  Social History Narrative  . Not on file    Family History  Problem Relation Age of Onset  . Lupus Paternal Aunt   . Breast cancer Other   . Ovarian cancer Neg Hx   . Colon cancer Neg Hx     The following portions of the patient's history were reviewed and updated as appropriate: allergies, current medications, past OB history, past medical history, past surgical history, past family history, past social history, and problem list.  Review of Systems:  ROS negative except as noted above. Information obtained from patient.   OBJECTIVE:  BP (!) 89/63   Pulse 77   Wt 127 lb 4.8 oz (57.7 kg)   LMP 02/14/2019 (Exact Date)   BMI 21.85 kg/m   Initial Physical Exam (New OB)  GENERAL APPEARANCE: alert, well appearing, in no apparent distress  HEAD: normocephalic, atraumatic  MOUTH: mucous membranes moist, pharynx normal without lesions  THYROID: no thyromegaly or masses present  BREASTS: no  masses noted, no significant tenderness, no palpable axillary nodes, no skin changes  LUNGS: clear to auscultation, no wheezes, rales or rhonchi, symmetric air entry  HEART: regular rate and rhythm, no murmurs  ABDOMEN: soft, nontender, nondistended, no abnormal masses, no epigastric pain and FHT present  EXTREMITIES: no redness or tenderness in the calves or thighs, no edema  SKIN: normal coloration and turgor, no rashes  LYMPH NODES: no adenopathy palpable  NEUROLOGIC: alert, oriented, normal speech, no focal findings or movement disorder noted  PELVIC EXAM: not examined  ASSESSMENT: Normal pregnancy Declines genetic screening Pelvic pressure-Encouraged V2 supporter Breastfeeding while pregnant  Rh positive   PLAN: Prenatal care New OB counseling: The patient has been given an overview regarding routine prenatal care. Recommendations regarding diet, weight gain, and exercise in pregnancy were given. Prenatal testing, optional genetic testing, and ultrasound use in pregnancy were reviewed.  Benefits of Breast Feeding were discussed. The patient is encouraged to consider nursing her baby post partum. See orders

## 2019-05-09 ENCOUNTER — Encounter: Payer: 59 | Admitting: Certified Nurse Midwife

## 2019-05-15 ENCOUNTER — Other Ambulatory Visit: Payer: Self-pay | Admitting: Certified Nurse Midwife

## 2019-05-15 DIAGNOSIS — R509 Fever, unspecified: Secondary | ICD-10-CM

## 2019-05-15 NOTE — Progress Notes (Signed)
Pt called with c/o fever, sore throat, body ache and head ache. She is [redacted] wks pregnant. Order placed for covid testing. Pt instructions given regarding testing.   Philip Aspen, CNM

## 2019-05-16 ENCOUNTER — Telehealth: Payer: Self-pay

## 2019-05-16 NOTE — Telephone Encounter (Signed)
-----   Message from Philip Aspen, [place] sent at 05/16/2019  3:27 PM EDT ----- Ivin Booty,   I put an order in yesterday for this pt to have covid testing. It does not look like she has had it done. Can we call her and follow up that she has had testing. She was symptomatic with fever, sore throat, body ache, headache.   Thanks,  Deneise Lever

## 2019-05-16 NOTE — Telephone Encounter (Signed)
Spoke with patient regarding testing for COVID 19. She stated " I went by there and they told me they would call me to make an appointment." Reinforced the importance of getting tested before her next [facility] appointment. Requested she call and let us know when she does go to get tested.

## 2019-05-19 ENCOUNTER — Other Ambulatory Visit: Payer: 59

## 2019-05-19 ENCOUNTER — Telehealth: Payer: Self-pay | Admitting: General Practice

## 2019-05-19 DIAGNOSIS — Z20822 Contact with and (suspected) exposure to covid-19: Secondary | ICD-10-CM

## 2019-05-19 NOTE — Telephone Encounter (Signed)
Patient is calling to get COVID testing. Order has been placed. Thank you CB- [phone]

## 2019-05-19 NOTE — Telephone Encounter (Signed)
Scheduled patient for COVID 19 test today at [facility] at 1pm.  Testing protocol reviewed with patient.

## 2019-05-24 ENCOUNTER — Telehealth: Payer: Self-pay

## 2019-05-24 NOTE — Telephone Encounter (Signed)
Message left for patient to return my call- re: did she get the testing done and where.

## 2019-05-24 NOTE — Telephone Encounter (Signed)
-----   Message from Philip Aspen, [place] sent at 05/24/2019  8:01 AM EDT ----- Can you call to see if she had went and had this done?   TY ----- Message ----- From: SYSTEM Sent: 05/24/2019  12:05 AM EDT To: Philip Aspen, CNM

## 2019-05-25 LAB — NOVEL CORONAVIRUS, NAA: SARS-CoV-2, NAA: NOT DETECTED

## 2019-05-26 ENCOUNTER — Telehealth: Payer: Self-pay

## 2019-05-26 NOTE — Telephone Encounter (Signed)
Voicemail message left for patient- negative COVID 19 result

## 2019-06-07 ENCOUNTER — Encounter: Payer: Self-pay | Admitting: Certified Nurse Midwife

## 2019-06-07 ENCOUNTER — Other Ambulatory Visit: Payer: Self-pay

## 2019-06-07 ENCOUNTER — Ambulatory Visit (INDEPENDENT_AMBULATORY_CARE_PROVIDER_SITE_OTHER): Payer: 59 | Admitting: Certified Nurse Midwife

## 2019-06-07 VITALS — BP 85/58 | HR 75 | Wt 128.5 lb

## 2019-06-07 DIAGNOSIS — Z3492 Encounter for supervision of normal pregnancy, unspecified, second trimester: Secondary | ICD-10-CM

## 2019-06-07 LAB — POCT URINALYSIS DIPSTICK OB
Bilirubin, UA: NEGATIVE
Blood, UA: NEGATIVE
Glucose, UA: NEGATIVE
Ketones, UA: NEGATIVE
Nitrite, UA: NEGATIVE
POC,PROTEIN,UA: NEGATIVE
Spec Grav, UA: 1.025 (ref 1.010–1.025)
Urobilinogen, UA: 0.2 E.U./dL
pH, UA: 6 (ref 5.0–8.0)

## 2019-06-07 NOTE — Progress Notes (Signed)
ROB-Patient c/o intermittent pelvic pressure "it feels sore" x1 month.  Patient also c/o anxiety and depression x1 month.

## 2019-06-07 NOTE — Progress Notes (Signed)
ROB doing well. Reviewed round ligament pain . Encouraged belly band use and tylenol as needed. Gad score 11 (moderate) PHQ9 15(moderate). Reviewed and discussed counseling and medications. 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 Information given on Zoloft. She will discuss with her partner and let me know if she would like to start medications. Discussed anatomy scan at next visit. She verbalizes and agrees to plan. Follow up 4 wks   Philip Aspen, CNM

## 2019-06-07 NOTE — Patient Instructions (Signed)

## 2019-07-05 ENCOUNTER — Telehealth: Payer: Self-pay

## 2019-07-05 NOTE — Telephone Encounter (Signed)
Coronavirus (COVID-19) Are you at risk?  Are you at risk for the Coronavirus (COVID-19)?  To be considered HIGH RISK for Coronavirus (COVID-19), you have to meet the following criteria:  . Traveled to [place], [place], [place], [place] or [place]; or in the [place] to [place], [place], [place], or [place]; and have fever, cough, and shortness of breath within the last 2 weeks of travel OR . Been in close contact with a person diagnosed with COVID-19 within the last 2 weeks and have fever, cough, and shortness of breath . IF YOU DO NOT MEET THESE CRITERIA, YOU ARE CONSIDERED LOW RISK FOR COVID-19.  What to do if you are HIGH RISK for COVID-19?  . If you are having a medical emergency, call 911. . Seek medical care right away. Before you go to a doctor's office, urgent care or emergency department, call ahead and tell them about your recent travel, contact with someone diagnosed with COVID-19, and your symptoms. You should receive instructions from your physician's office regarding next steps of care.  . When you arrive at healthcare provider, tell the healthcare staff immediately you have returned from visiting [place], [place], [place], [place] or [place]; or traveled in the [place] to [place], [place], [place], or [place]; in the last two weeks or you have been in close contact with a person diagnosed with COVID-19 in the last 2 weeks.   . Tell the health care staff about your symptoms: fever, cough and shortness of breath. . After you have been seen by a medical provider, you will be either: o Tested for (COVID-19) and discharged home on quarantine except to seek medical care if symptoms worsen, and asked to  - Stay home and avoid contact with others until you get your results (4-5 days)  - Avoid travel on public transportation if possible (such as bus, train, or airplane) or o Sent to the Emergency Department by EMS for evaluation, COVID-19 testing, and possible  admission depending on your condition and test results.  What to do if you are LOW RISK for COVID-19?  Reduce your risk of any infection by using the same precautions used for avoiding the common cold or flu:  . Wash your hands often with soap and warm water for at least 20 seconds.  If soap and water are not readily available, use an alcohol-based hand sanitizer with at least 60% alcohol.  . If coughing or sneezing, cover your mouth and nose by coughing or sneezing into the elbow areas of your shirt or coat, into a tissue or into your sleeve (not your hands). . Avoid shaking hands with others and consider head nods or verbal greetings only. . Avoid touching your eyes, nose, or mouth with unwashed hands.  . Avoid close contact with people who are Wendy Castro. . Avoid places or events with large numbers of people in one location, like concerts or sporting events. . Carefully consider travel plans you have or are making. . If you are planning any travel outside or inside the [place], visit the CDC's Travelers' Health webpage for the latest health notices. . If you have some symptoms but not all symptoms, continue to monitor at home and seek medical attention if your symptoms worsen. . If you are having a medical emergency, call 911.  07/05/19 SCREENING NEG SLS ADDITIONAL HEALTHCARE OPTIONS FOR PATIENTS  [facility] / e-Visit: [email or web address]         [facility]: [phone]    [facility]: [phone]                   [facility]: [phone]  

## 2019-07-06 ENCOUNTER — Ambulatory Visit (INDEPENDENT_AMBULATORY_CARE_PROVIDER_SITE_OTHER): Payer: 59 | Admitting: Certified Nurse Midwife

## 2019-07-06 ENCOUNTER — Encounter: Payer: 59 | Admitting: Obstetrics and Gynecology

## 2019-07-06 ENCOUNTER — Other Ambulatory Visit: Payer: Self-pay

## 2019-07-06 ENCOUNTER — Ambulatory Visit (INDEPENDENT_AMBULATORY_CARE_PROVIDER_SITE_OTHER): Payer: 59

## 2019-07-06 ENCOUNTER — Other Ambulatory Visit: Payer: Self-pay | Admitting: Certified Nurse Midwife

## 2019-07-06 VITALS — BP 93/57 | HR 81 | Wt 135.5 lb

## 2019-07-06 DIAGNOSIS — Z3492 Encounter for supervision of normal pregnancy, unspecified, second trimester: Secondary | ICD-10-CM

## 2019-07-06 DIAGNOSIS — Z3A20 20 weeks gestation of pregnancy: Secondary | ICD-10-CM

## 2019-07-06 LAB — POCT URINALYSIS DIPSTICK OB
Bilirubin, UA: NEGATIVE
Blood, UA: NEGATIVE
Glucose, UA: NEGATIVE
Ketones, UA: NEGATIVE
Nitrite, UA: NEGATIVE
POC,PROTEIN,UA: NEGATIVE
Spec Grav, UA: 1.02 (ref 1.010–1.025)
Urobilinogen, UA: 0.2 E.U./dL
pH, UA: 6.5 (ref 5.0–8.0)

## 2019-07-06 NOTE — Progress Notes (Signed)
ROB-Doing well, no questions or concerns. Anatomy scan today complete and normal, see below. Anticipatory guidance regarding course of prenatal care. Reviewed red flag symptoms and when to call. RTC x 4 weeks for ROB or sooner if needed.   ULTRASOUND REPORT  Location: [facility] Date of Service: 07/06/2019   Indications:Anatomy Ultrasound Findings:  Singleton intrauterine pregnancy is visualized with FHR at 141 BPM. Biometrics give an (U/S) Gestational age of [redacted]w[redacted]d and an (U/S) EDD of 11/21/2019; this correlates with the clinically established Estimated Date of Delivery: 11/19/19  Fetal presentation is Variable.  EFW: 332 g ( 12 oz ). Placenta: anterior. Grade: 1 AFI: subjectively normal.  Anatomic survey is complete and normal; Gender - surprise.    Right Ovary is normal in appearance. Left Ovary is normal appearance. Survey of the adnexa demonstrates no adnexal masses. There is no free peritoneal fluid in the cul de sac.  Impression: 1. [redacted]w[redacted]d Viable Singleton Intrauterine pregnancy by U/S. 2. (U/S) EDD is consistent with Clinically established Estimated Date of Delivery: 11/19/19 . 3. Normal Anatomy Scan  Recommendations: 1.Clinical correlation with the patient's History and Physical Exam.

## 2019-07-06 NOTE — Patient Instructions (Signed)
WHAT OB PATIENTS CAN EXPECT   Confirmation of pregnancy and ultrasound ordered if medically indicated-[redacted] weeks gestation  New OB (NOB) intake with nurse and New OB (NOB) labs- [redacted] weeks gestation  New OB (NOB) physical examination with provider- 11/[redacted] weeks gestation  Flu vaccine-[redacted] weeks gestation  Anatomy scan-[redacted] weeks gestation  Glucose tolerance test, blood work to test for anemia, T-dap vaccine-[redacted] weeks gestation  Vaginal swabs/cultures-STD/Group B strep-[redacted] weeks gestation  Appointments every 4 weeks until 28 weeks  Every 2 weeks from 28 weeks until 36 weeks  Weekly visits from 36 weeks until delivery  Round Ligament Pain  The round ligament is a cord of muscle and tissue that helps support the uterus. It can become a source of pain during pregnancy if it becomes stretched or twisted as the baby grows. The pain usually begins in the second trimester (13-28 weeks) of pregnancy, and it can come and go until the baby is delivered. It is not a serious problem, and it does not cause harm to the baby. Round ligament pain is usually a short, sharp, and pinching pain, but it can also be a dull, lingering, and aching pain. The pain is felt in the lower side of the abdomen or in the groin. It usually starts deep in the groin and moves up to the outside of the hip area. The pain may occur when you:  Suddenly change position, such as quickly going from a sitting to standing position.  Roll over in bed.  Cough or sneeze.  Do physical activity. Follow these instructions at home:   Watch your condition for any changes.  When the pain starts, relax. Then try any of these methods to help with the pain: ? Sitting down. ? Flexing your knees up to your abdomen. ? Lying on your side with one pillow under your abdomen and another pillow between your legs. ? Sitting in a warm bath for 15-20 minutes or until the pain goes away.  Take over-the-counter and prescription medicines only as told by  your health care provider.  Move slowly when you sit down or stand up.  Avoid long walks if they cause pain.  Stop or reduce your physical activities if they cause pain.  Keep all follow-up visits as told by your health care provider. This is important. Contact a health care provider if:  Your pain does not go away with treatment.  You feel pain in your back that you did not have before.  Your medicine is not helping. Get help right away if:  You have a fever or chills.  You develop uterine contractions.  You have vaginal bleeding.  You have nausea or vomiting.  You have diarrhea.  You have pain when you urinate. Summary  Round ligament pain is felt in the lower abdomen or groin. It is usually a short, sharp, and pinching pain. It can also be a dull, lingering, and aching pain.  This pain usually begins in the second trimester (13-28 weeks). It occurs because the uterus is stretching with the growing baby, and it is not harmful to the baby.  You may notice the pain when you suddenly change position, when you cough or sneeze, or during physical activity.  Relaxing, flexing your knees to your abdomen, lying on one side, or taking a warm bath may help to get rid of the pain.  Get help from your health care provider if the pain does not go away or if you have vaginal bleeding, nausea, vomiting,  diarrhea, or painful urination. This information is not intended to replace advice given to you by your health care provider. Make sure you discuss any questions you have with your health care provider. Document Released: 08/04/2008 Document Revised: 04/13/2018 Document Reviewed: 04/13/2018 Elsevier Patient Education  [place]. Back Pain in Pregnancy Back pain during pregnancy is common. Back pain may be caused by several factors that are related to changes during your pregnancy. Follow these instructions at home: Managing pain, stiffness, and swelling      If directed,  for sudden (acute) back pain, put ice on the painful area. ? Put ice in a plastic bag. ? Place a towel between your skin and the bag. ? Leave the ice on for 20 minutes, 2-3 times per day.  If directed, apply heat to the affected area before you exercise. Use the heat source that your health care provider recommends, such as a moist heat pack or a heating pad. ? Place a towel between your skin and the heat source. ? Leave the heat on for 20-30 minutes. ? Remove the heat if your skin turns bright red. This is especially important if you are unable to feel pain, heat, or cold. You may have a greater risk of getting burned.  If directed, massage the affected area. Activity  Exercise as told by your health care provider. Gentle exercise is the best way to prevent or manage back pain.  Listen to your body when lifting. If lifting hurts, ask for help or bend your knees. This uses your leg muscles instead of your back muscles.  Squat down when picking up something from the floor. Do not bend over.  Only use bed rest for short periods as told by your health care provider. Bed rest should only be used for the most severe episodes of back pain. Standing, sitting, and lying down  Do not stand in one place for long periods of time.  Use good posture when sitting. Make sure your head rests over your shoulders and is not hanging forward. Use a pillow on your lower back if necessary.  Try sleeping on your side, preferably the left side, with a pregnancy support pillow or 1-2 regular pillows between your legs. ? If you have back pain after a night's rest, your bed may be too soft. ? A firm mattress may provide more support for your back during pregnancy. General instructions  Do not wear high heels.  Eat a healthy diet. Try to gain weight within your health care provider's recommendations.  Use a maternity girdle, elastic sling, or back brace as told by your health care provider.  Take  over-the-counter and prescription medicines only as told by your health care provider.  Work with a physical therapist or massage therapist to find ways to manage back pain. Acupuncture or massage therapy may be helpful.  Keep all follow-up visits as told by your health care provider. This is important. Contact a health care provider if:  Your back pain interferes with your daily activities.  You have increasing pain in other parts of your body. Get help right away if:  You develop numbness, tingling, weakness, or problems with the use of your arms or legs.  You develop severe back pain that is not controlled with medicine.  You have a change in bowel or bladder control.  You develop shortness of breath, dizziness, or you faint.  You develop nausea, vomiting, or sweating.  You have back pain that is a rhythmic,  cramping pain similar to labor pains. Labor pain is usually 1-2 minutes apart, lasts for about 1 minute, and involves a bearing down feeling or pressure in your pelvis.  You have back pain and your water breaks or you have vaginal bleeding.  You have back pain or numbness that travels down your leg.  Your back pain developed after you fell.  You develop pain on one side of your back.  You see blood in your urine.  You develop skin blisters in the area of your back pain. Summary  Back pain may be caused by several factors that are related to changes during your pregnancy.  Follow instructions as told by your health care provider for managing pain, stiffness, and swelling.  Exercise as told by your health care provider. Gentle exercise is the best way to prevent or manage back pain.  Take over-the-counter and prescription medicines only as told by your health care provider.  Keep all follow-up visits as told by your health care provider. This is important. This information is not intended to replace advice given to you by your health care provider. Make sure you  discuss any questions you have with your health care provider. Document Released: 02/03/2006 Document Revised: 02/14/2019 Document Reviewed: 04/13/2018 Elsevier Patient Education  2020 [employer].

## 2019-07-06 NOTE — Progress Notes (Signed)
ROB-No complaints.  

## 2019-08-04 ENCOUNTER — Ambulatory Visit (INDEPENDENT_AMBULATORY_CARE_PROVIDER_SITE_OTHER): Payer: 59 | Admitting: Certified Nurse Midwife

## 2019-08-04 ENCOUNTER — Other Ambulatory Visit: Payer: Self-pay

## 2019-08-04 VITALS — BP 94/60 | HR 92 | Wt 143.1 lb

## 2019-08-04 DIAGNOSIS — Z3492 Encounter for supervision of normal pregnancy, unspecified, second trimester: Secondary | ICD-10-CM

## 2019-08-04 LAB — POCT URINALYSIS DIPSTICK OB
Bilirubin, UA: NEGATIVE
Blood, UA: NEGATIVE
Glucose, UA: NEGATIVE
Ketones, UA: NEGATIVE
Leukocytes, UA: NEGATIVE
Nitrite, UA: NEGATIVE
POC,PROTEIN,UA: NEGATIVE
Spec Grav, UA: 1.015 (ref 1.010–1.025)
Urobilinogen, UA: 0.2 E.U./dL
pH, UA: 5 (ref 5.0–8.0)

## 2019-08-04 NOTE — Progress Notes (Signed)
ROB doing well. Feels good movement. Anticipatory guidance regarding 28 wk visit. Reviewed Ready set baby. Pt refused flu shot. Follow up 4 wks for glucose testing.   Philip Aspen, CNM

## 2019-08-04 NOTE — Patient Instructions (Signed)

## 2019-08-08 ENCOUNTER — Other Ambulatory Visit: Payer: Self-pay

## 2019-08-08 ENCOUNTER — Telehealth: Payer: Self-pay | Admitting: Certified Nurse Midwife

## 2019-08-08 ENCOUNTER — Ambulatory Visit: Payer: Self-pay

## 2019-08-08 ENCOUNTER — Telehealth: Payer: Self-pay

## 2019-08-08 ENCOUNTER — Observation Stay
Admission: EM | Admit: 2019-08-08 | Discharge: 2019-08-08 | Disposition: A | Discharge status: Home / Self Care | Payer: 59 | Attending: Obstetrics and Gynecology | Admitting: Obstetrics and Gynecology

## 2019-08-08 DIAGNOSIS — O26892 Other specified pregnancy related conditions, second trimester: Principal | ICD-10-CM | POA: Insufficient documentation

## 2019-08-08 DIAGNOSIS — Z3A25 25 weeks gestation of pregnancy: Secondary | ICD-10-CM | POA: Insufficient documentation

## 2019-08-08 DIAGNOSIS — R109 Unspecified abdominal pain: Secondary | ICD-10-CM | POA: Diagnosis not present

## 2019-08-08 DIAGNOSIS — R1030 Lower abdominal pain, unspecified: Secondary | ICD-10-CM | POA: Diagnosis present

## 2019-08-08 HISTORY — DX: Anxiety disorder, unspecified: F41.9

## 2019-08-08 LAB — URINALYSIS, ROUTINE W REFLEX MICROSCOPIC
Bilirubin Urine: NEGATIVE
Glucose, UA: NEGATIVE mg/dL
Hgb urine dipstick: NEGATIVE
Ketones, ur: NEGATIVE mg/dL
Nitrite: NEGATIVE
Protein, ur: NEGATIVE mg/dL
Specific Gravity, Urine: 1.014 (ref 1.005–1.030)
pH: 6 (ref 5.0–8.0)

## 2019-08-08 NOTE — OB Triage Note (Signed)
Pt 23 yo, G2P1, [redacted]w[redacted]d, presents to unit w c/o lower abdominal cramping that began around 9 am this morning. Said she spoke with a nurse at [facility], advised to hydrate and try PO tylenol. Pt reports this did not help. Denies vaginal bleeding, LOF, and decreased fetal movement. Rates cramping at 7/10, but states it does not feel like ctx pain. Monitors applied and assessing, Vitals stable. Will continue to assess.

## 2019-08-08 NOTE — Telephone Encounter (Signed)
Patient called stating she is experiencing cramping and soreness in her belly area. States there is still good fetal movement . Please Advise.

## 2019-08-08 NOTE — Telephone Encounter (Signed)
Message left for patient- contact office if spotting/bleeding occurs or cramping/pain becomes worse. Encouraged to stay hydrated and use tylenol.

## 2019-08-08 NOTE — Telephone Encounter (Signed)
Pt. Reports she is [redacted] weeks pregnant and having some abdominal pain and cramping. Instructed to call her OB/GYN. Verbalizes understanding.

## 2019-08-08 NOTE — Discharge Instructions (Signed)
Abdominal Pain During Pregnancy  Belly (abdominal) pain is common during pregnancy. There are many possible causes. Most of the time, it is not a serious problem. Other times, it can be a sign that something is wrong with the pregnancy. Always tell your doctor if you have belly pain. Follow these instructions at home:  Do not have sex or put anything in your vagina until your pain goes away completely.  Get plenty of rest until your pain gets better.  Drink enough fluid to keep your pee (urine) pale yellow.  Take over-the-counter and prescription medicines only as told by your doctor.  Keep all follow-up visits as told by your doctor. This is important. Contact a doctor if:  Your pain continues or gets worse after resting.  You have lower belly pain that: ? Comes and goes at regular times. ? Spreads to your back. ? Feels like menstrual cramps.  You have pain or burning when you pee (urinate). Get help right away if:  You have a fever or chills.  You have vaginal bleeding.  You are leaking fluid from your vagina.  You are passing tissue from your vagina.  You throw up (vomit) for more than 24 hours.  You have watery poop (diarrhea) for more than 24 hours.  Your baby is moving less than usual.  You feel very weak or faint.  You have shortness of breath.  You have very bad pain in your upper belly. Summary  Belly (abdominal) pain is common during pregnancy. There are many possible causes.  If you have belly pain during pregnancy, tell your doctor right away.  Keep all follow-up visits as told by your doctor. This is important. This information is not intended to replace advice given to you by your health care provider. Make sure you discuss any questions you have with your health care provider. Document Released: 10/14/2009 Document Revised: 02/13/2019 Document Reviewed: 01/28/2017 Elsevier Patient Education  2020 [employer]. Preterm Labor and Birth  Information Pregnancy normally lasts 39-41 weeks. Preterm labor is when labor starts early. It starts before you have been pregnant for 37 whole weeks. What are the risk factors for preterm labor? Preterm labor is more likely to occur in women who:  Have an infection while pregnant.  Have a cervix that is short.  Have gone into preterm labor before.  Have had surgery on their cervix.  Are younger than age 56.  Are older than age 68.  Are African American.  Are pregnant with two or more babies.  Take street drugs while pregnant.  Smoke while pregnant.  Do not gain enough weight while pregnant.  Got pregnant right after another pregnancy. What are the symptoms of preterm labor? Symptoms of preterm labor include:  Cramps. The cramps may feel like the cramps some women get during their period. The cramps may happen with watery poop (diarrhea).  Pain in the belly (abdomen).  Pain in the lower back.  Regular contractions or tightening. It may feel like your belly is getting tighter.  Pressure in the lower belly that seems to get stronger.  More fluid (discharge) leaking from the vagina. The fluid may be watery or bloody.  Water breaking. Why is it important to notice signs of preterm labor? Babies who are born early may not be fully developed. They have a higher chance for:  Long-term heart problems.  Long-term lung problems.  Trouble controlling body systems, like breathing.  Bleeding in the brain.  A condition called cerebral palsy.  Learning difficulties.  Death. These risks are highest for babies who are born before 34 weeks of pregnancy. How is preterm labor treated? Treatment depends on:  How long you were pregnant.  Your condition.  The health of your baby. Treatment may involve:  Having a stitch (suture) placed in your cervix. When you give birth, your cervix opens so the baby can come out. The stitch keeps the cervix from opening too  soon.  Staying at the hospital.  Taking or getting medicines, such as: ? Hormone medicines. ? Medicines to stop contractions. ? Medicines to help the babys lungs develop. ? Medicines to prevent your baby from having cerebral palsy. What should I do if I am in preterm labor? If you think you are going into labor too soon, call your doctor right away. How can I prevent preterm labor?  Do not use any tobacco products. ? Examples of these are cigarettes, chewing tobacco, and e-cigarettes. ? If you need help quitting, ask your doctor.  Do not use street drugs.  Do not use any medicines unless you ask your doctor if they are safe for you.  Talk with your doctor before taking any herbal supplements.  Make sure you gain enough weight.  Watch for infection. If you think you might have an infection, get it checked right away.  If you have gone into preterm labor before, tell your doctor. This information is not intended to replace advice given to you by your health care provider. Make sure you discuss any questions you have with your health care provider. Document Released: 01/22/2009 Document Revised: 02/17/2019 Document Reviewed: 03/18/2016 Elsevier Patient Education  2020 [employer].

## 2019-08-10 LAB — CULTURE, OB URINE: Culture: NO GROWTH

## 2019-08-11 NOTE — Discharge Summary (Signed)
L&D OB Triage Note  Wendy Castro is a 23 y.o. G2P1001 female at [redacted]w[redacted]d, EDD Estimated Date of Delivery: 11/19/19 who presented to triage for complaints of lower abdominal cramping that radiates around to back, worse with mov't, not relieved with hydration.Marland Kitchen  She was evaluated by the nurses with no significant findings/findings significant for preterm contractions and none noted. Vital signs stable. An NST was performed and has been reviewed by Me. She was treated with po hydration and reassured of normal findings, urine sent for culture..   NST INTERPRETATION: Indications: rule out uterine contractions  Mode: External Baseline Rate (A): 150 bpm Variability: Moderate Accelerations: 15 x 15 Decelerations: None     Contraction Frequency (min): none  Impression: reactive   Plan: NST performed was reviewed and was found to be reactive. She was discharged home with bleeding/labor precautions.  Continue routine prenatal care. Follow up with OB/GYN as previously scheduled.     Owen Pratte Rockney Ghee, CNM

## 2019-09-08 ENCOUNTER — Other Ambulatory Visit: Payer: 59

## 2019-09-08 ENCOUNTER — Encounter: Payer: 59 | Admitting: Obstetrics and Gynecology

## 2019-09-15 ENCOUNTER — Encounter: Payer: 59 | Admitting: Obstetrics and Gynecology

## 2019-09-15 ENCOUNTER — Other Ambulatory Visit: Payer: 59

## 2019-09-15 ENCOUNTER — Ambulatory Visit (INDEPENDENT_AMBULATORY_CARE_PROVIDER_SITE_OTHER): Payer: 59 | Admitting: Certified Nurse Midwife

## 2019-09-15 ENCOUNTER — Other Ambulatory Visit: Payer: Self-pay

## 2019-09-15 VITALS — BP 90/60 | HR 83 | Wt 152.9 lb

## 2019-09-15 DIAGNOSIS — Z3493 Encounter for supervision of normal pregnancy, unspecified, third trimester: Secondary | ICD-10-CM

## 2019-09-15 DIAGNOSIS — Z3492 Encounter for supervision of normal pregnancy, unspecified, second trimester: Secondary | ICD-10-CM

## 2019-09-15 DIAGNOSIS — Z23 Encounter for immunization: Secondary | ICD-10-CM

## 2019-09-15 LAB — POCT URINALYSIS DIPSTICK OB
Bilirubin, UA: NEGATIVE
Blood, UA: NEGATIVE
Glucose, UA: NEGATIVE
Ketones, UA: NEGATIVE
Nitrite, UA: NEGATIVE
POC,PROTEIN,UA: NEGATIVE
Spec Grav, UA: 1.01 (ref 1.010–1.025)
Urobilinogen, UA: 0.2 E.U./dL
pH, UA: 7 (ref 5.0–8.0)

## 2019-09-15 MED ORDER — TETANUS-DIPHTH-ACELL PERTUSSIS 5-2.5-18.5 LF-MCG/0.5 IM SUSP
0.5000 mL | Freq: Once | INTRAMUSCULAR | Status: AC
  Administered 2019-09-15: 0.5 mL via INTRAMUSCULAR

## 2019-09-15 NOTE — Progress Notes (Addendum)
ROB-No complaints.  

## 2019-09-15 NOTE — Patient Instructions (Signed)
Contraception Choices Contraception, also called birth control, means things to use or ways to try not to get pregnant. Hormonal birth control This kind of birth control uses hormones. Here are some types of hormonal birth control:  A tube that is put under skin of the arm (implant). The tube can stay in for as long as 3 years.  Shots to get every 3 months (injections).  Pills to take every day (birth control pills).  A patch to change 1 time each week for 3 weeks (birth control patch). After that, the patch is taken off for 1 week.  A ring to put in the vagina. The ring is left in for 3 weeks. Then it is taken out of the vagina for 1 week. Then a new ring is put in.  Pills to take after unprotected sex (emergency birth control pills). Barrier birth control Here are some types of barrier birth control:  A thin covering that is put on the penis before sex (female condom). The covering is thrown away after sex.  A soft, loose covering that is put in the vagina before sex (female condom). The covering is thrown away after sex.  A rubber bowl that sits over the cervix (diaphragm). The bowl must be made for you. The bowl is put into the vagina before sex. The bowl is left in for 6-8 hours after sex. It is taken out within 24 hours.  A small, soft cup that fits over the cervix (cervical cap). The cup must be made for you. The cup can be left in for 6-8 hours after sex. It is taken out within 48 hours.  A sponge that is put into the vagina before sex. It must be left in for at least 6 hours after sex. It must be taken out within 30 hours. Then it is thrown away.  A chemical that kills or stops sperm from getting into the uterus (spermicide). It may be a pill, cream, jelly, or foam to put in the vagina. The chemical should be used at least 10-15 minutes before sex. IUD (intrauterine) birth control An IUD is a small, T-shaped piece of plastic. It is put inside the uterus. There are two kinds:   Hormone IUD. This kind can stay in for 3-5 years.  Copper IUD. This kind can stay in for 10 years. Permanent birth control Here are some types of permanent birth control:  Surgery to block the fallopian tubes.  Having an insert put into each fallopian tube.  Surgery to tie off the tubes that carry sperm (vasectomy). Natural planning birth control Here are some types of natural planning birth control:  Not having sex on the days the woman could get pregnant.  Using a calendar: ? To keep track of the length of each period. ? To find out what days pregnancy can happen. ? To plan to not have sex on days when pregnancy can happen.  Watching for symptoms of ovulation and not having sex during ovulation. One way the woman can check for ovulation is to check her temperature.  Waiting to have sex until after ovulation. Summary  Contraception, also called birth control, means things to use or ways to try not to get pregnant.  Hormonal methods of birth control include implants, injections, pills, patches, vaginal rings, and emergency birth control pills.  Barrier methods of birth control can include female condoms, female condoms, diaphragms, cervical caps, sponges, and spermicides.  There are two types of IUD (intrauterine device) birth control.  An IUD can be put in a woman's uterus to prevent pregnancy for 3-5 years.  Permanent sterilization can be done through a procedure for males, females, or both.  Natural planning methods involve not having sex on the days when the woman could get pregnant. This information is not intended to replace advice given to you by your health care provider. Make sure you discuss any questions you have with your health care provider. Document Released: 08/23/2009 Document Revised: 02/15/2019 Document Reviewed: 11/05/2016 Elsevier Patient Education  2020 [place]. Round Ligament Pain  The round ligament is a cord of muscle and tissue that helps support  the uterus. It can become a source of pain during pregnancy if it becomes stretched or twisted as the baby grows. The pain usually begins in the second trimester (13-28 weeks) of pregnancy, and it can come and go until the baby is delivered. It is not a serious problem, and it does not cause harm to the baby. Round ligament pain is usually a short, sharp, and pinching pain, but it can also be a dull, lingering, and aching pain. The pain is felt in the lower side of the abdomen or in the groin. It usually starts deep in the groin and moves up to the outside of the hip area. The pain may occur when you:  Suddenly change position, such as quickly going from a sitting to standing position.  Roll over in bed.  Cough or sneeze.  Do physical activity. Follow these instructions at home:   Watch your condition for any changes.  When the pain starts, relax. Then try any of these methods to help with the pain: ? Sitting down. ? Flexing your knees up to your abdomen. ? Lying on your side with one pillow under your abdomen and another pillow between your legs. ? Sitting in a warm bath for 15-20 minutes or until the pain goes away.  Take over-the-counter and prescription medicines only as told by your health care provider.  Move slowly when you sit down or stand up.  Avoid long walks if they cause pain.  Stop or reduce your physical activities if they cause pain.  Keep all follow-up visits as told by your health care provider. This is important. Contact a health care provider if:  Your pain does not go away with treatment.  You feel pain in your back that you did not have before.  Your medicine is not helping. Get help right away if:  You have a fever or chills.  You develop uterine contractions.  You have vaginal bleeding.  You have nausea or vomiting.  You have diarrhea.  You have pain when you urinate. Summary  Round ligament pain is felt in the lower abdomen or groin. It is  usually a short, sharp, and pinching pain. It can also be a dull, lingering, and aching pain.  This pain usually begins in the second trimester (13-28 weeks). It occurs because the uterus is stretching with the growing baby, and it is not harmful to the baby.  You may notice the pain when you suddenly change position, when you cough or sneeze, or during physical activity.  Relaxing, flexing your knees to your abdomen, lying on one side, or taking a warm bath may help to get rid of the pain.  Get help from your health care provider if the pain does not go away or if you have vaginal bleeding, nausea, vomiting, diarrhea, or painful urination. This information is not intended to  replace advice given to you by your health care provider. Make sure you discuss any questions you have with your health care provider. Document Released: 08/04/2008 Document Revised: 04/13/2018 Document Reviewed: 04/13/2018 Elsevier Patient Education  [place]. Back Pain in Pregnancy Back pain during pregnancy is common. Back pain may be caused by several factors that are related to changes during your pregnancy. Follow these instructions at home: Managing pain, stiffness, and swelling      If directed, for sudden (acute) back pain, put ice on the painful area. ? Put ice in a plastic bag. ? Place a towel between your skin and the bag. ? Leave the ice on for 20 minutes, 2-3 times per day.  If directed, apply heat to the affected area before you exercise. Use the heat source that your health care provider recommends, such as a moist heat pack or a heating pad. ? Place a towel between your skin and the heat source. ? Leave the heat on for 20-30 minutes. ? Remove the heat if your skin turns bright red. This is especially important if you are unable to feel pain, heat, or cold. You may have a greater risk of getting burned.  If directed, massage the affected area. Activity  Exercise as told by your health  care provider. Gentle exercise is the best way to prevent or manage back pain.  Listen to your body when lifting. If lifting hurts, ask for help or bend your knees. This uses your leg muscles instead of your back muscles.  Squat down when picking up something from the floor. Do not bend over.  Only use bed rest for short periods as told by your health care provider. Bed rest should only be used for the most severe episodes of back pain. Standing, sitting, and lying down  Do not stand in one place for long periods of time.  Use good posture when sitting. Make sure your head rests over your shoulders and is not hanging forward. Use a pillow on your lower back if necessary.  Try sleeping on your side, preferably the left side, with a pregnancy support pillow or 1-2 regular pillows between your legs. ? If you have back pain after a night's rest, your bed may be too soft. ? A firm mattress may provide more support for your back during pregnancy. General instructions  Do not wear high heels.  Eat a healthy diet. Try to gain weight within your health care provider's recommendations.  Use a maternity girdle, elastic sling, or back brace as told by your health care provider.  Take over-the-counter and prescription medicines only as told by your health care provider.  Work with a physical therapist or massage therapist to find ways to manage back pain. Acupuncture or massage therapy may be helpful.  Keep all follow-up visits as told by your health care provider. This is important. Contact a health care provider if:  Your back pain interferes with your daily activities.  You have increasing pain in other parts of your body. Get help right away if:  You develop numbness, tingling, weakness, or problems with the use of your arms or legs.  You develop severe back pain that is not controlled with medicine.  You have a change in bowel or bladder control.  You develop shortness of breath,  dizziness, or you faint.  You develop nausea, vomiting, or sweating.  You have back pain that is a rhythmic, cramping pain similar to labor pains. Labor pain is usually  1-2 minutes apart, lasts for about 1 minute, and involves a bearing down feeling or pressure in your pelvis.  You have back pain and your water breaks or you have vaginal bleeding.  You have back pain or numbness that travels down your leg.  Your back pain developed after you fell.  You develop pain on one side of your back.  You see blood in your urine.  You develop skin blisters in the area of your back pain. Summary  Back pain may be caused by several factors that are related to changes during your pregnancy.  Follow instructions as told by your health care provider for managing pain, stiffness, and swelling.  Exercise as told by your health care provider. Gentle exercise is the best way to prevent or manage back pain.  Take over-the-counter and prescription medicines only as told by your health care provider.  Keep all follow-up visits as told by your health care provider. This is important. This information is not intended to replace advice given to you by your health care provider. Make sure you discuss any questions you have with your health care provider. Document Released: 02/03/2006 Document Revised: 02/14/2019 Document Reviewed: 04/13/2018 Elsevier Patient Education  2020 [place]. WHAT OB PATIENTS CAN EXPECT   Confirmation of pregnancy and ultrasound ordered if medically indicated-[redacted] weeks gestation  New OB (NOB) intake with nurse and New OB (NOB) labs- [redacted] weeks gestation  New OB (NOB) physical examination with provider- 11/[redacted] weeks gestation  Flu vaccine-[redacted] weeks gestation  Anatomy scan-[redacted] weeks gestation  Glucose tolerance test, blood work to test for anemia, T-dap vaccine-[redacted] weeks gestation  Vaginal swabs/cultures-STD/Group B strep-[redacted] weeks gestation  Appointments every 4 weeks until  28 weeks  Every 2 weeks from 28 weeks until 36 weeks  Weekly visits from 36 weeks until delivery  Common Medications Safe in Pregnancy  Acne:      Constipation:  Benzoyl Peroxide     Colace  Clindamycin      Dulcolax Suppository  Topica Erythromycin     Fibercon  Salicylic Acid      Metamucil         Miralax AVOID:        Senakot   Accutane    Cough:  Retin-A       Cough Drops  Tetracycline      Phenergan w/ Codeine if Rx  Minocycline      Robitussin (Plain & DM)  Antibiotics:     Crabs/Lice:  Ceclor       RID  Cephalosporins    AVOID:  E-Mycins      Kwell  Keflex  Macrobid/Macrodantin   Diarrhea:  Penicillin      Kao-Pectate  Zithromax      Imodium AD         PUSH FLUIDS AVOID:       Cipro     Fever:  Tetracycline      Tylenol (Regular or Extra  Minocycline       Strength)  Levaquin      Extra Strength-Do not          Exceed 8 tabs/24 hrs Caffeine:        '200mg'$ /day (equiv. To 1 cup of coffee or  approx. 3 12 oz sodas)         Gas: Cold/Hayfever:       Gas-X  Benadryl      Mylicon  Claritin       Phazyme  **Claritin-D  Chlor-Trimeton    Headaches:  Dimetapp      ASA-Free Excedrin  Drixoral-Non-Drowsy     Cold Compress  Mucinex (Guaifenasin)     Tylenol (Regular or Extra  Sudafed/Sudafed-12 Hour     Strength)  **Sudafed PE Pseudoephedrine   Tylenol Cold & Sinus     Vicks Vapor Rub  Zyrtec  **AVOID if Problems With Blood Pressure         Heartburn: Avoid lying down for at least 1 hour after meals  Aciphex      Maalox     Rash:  Milk of Magnesia     Benadryl    Mylanta       1% Hydrocortisone Cream  Pepcid  Pepcid Complete   Sleep Aids:  Prevacid      Ambien   Prilosec       Benadryl  Rolaids       Chamomile Tea  Tums (Limit 4/day)     Unisom  Zantac       Tylenol PM         Warm milk-add vanilla or  Hemorrhoids:       Sugar for taste  Anusol/Anusol H.C.  (RX: Analapram 2.5%)  Sugar Substitutes:  Hydrocortisone OTC     Ok in moderation   Preparation H      Tucks        Vaseline lotion applied to tissue with wiping    Herpes:     Throat:  Acyclovir      Oragel  Famvir  Valtrex     Vaccines:         Flu Shot Leg Cramps:       *Gardasil  Benadryl      Hepatitis A         Hepatitis B Nasal Spray:       Pneumovax  Saline Nasal Spray     Polio Booster         Tetanus Nausea:       Tuberculosis test or PPD  Vitamin B6 25 mg TID   AVOID:    Dramamine      *Gardasil  Emetrol       Live Poliovirus  Ginger Root 250 mg QID    MMR (measles, mumps &  High Complex Carbs @ Bedtime    rebella)  Sea Bands-Accupressure    Varicella (Chickenpox)  Unisom 1/2 tab TID     *No known complications           If received before Pain:         Known pregnancy;   Darvocet       Resume series after  Lortab        Delivery  Percocet    Yeast:   Tramadol      Femstat  Tylenol 3      Gyne-lotrimin  Ultram       Monistat  Vicodin           MISC:         All Sunscreens           Hair Coloring/highlights          Insect Repellant's          (Including DEET)         Mystic Tans Vaginal Delivery  Vaginal delivery means that you give birth by pushing your baby out of your birth canal (vagina). A team of health care providers will help you before, during, and after vaginal delivery.  Birth experiences are unique for every woman and every pregnancy, and birth experiences vary depending on where you choose to give birth. What happens when I arrive at the birth center or hospital? Once you are in labor and have been admitted into the hospital or birth center, your health care provider may:  Review your pregnancy history and any concerns that you have.  Insert an IV into one of your veins. This may be used to give you fluids and medicines.  Check your blood pressure, pulse, temperature, and heart rate (vital signs).  Check whether your bag of water (amniotic sac) has broken (ruptured).  Talk with you about your birth plan and discuss pain  control options. Monitoring Your health care provider may monitor your contractions (uterine monitoring) and your baby's heart rate (fetal monitoring). You may need to be monitored:  Often, but not continuously (intermittently).  All the time or for long periods at a time (continuously). Continuous monitoring may be needed if: ? You are taking certain medicines, such as medicine to relieve pain or make your contractions stronger. ? You have pregnancy or labor complications. Monitoring may be done by:  Placing a special stethoscope or a handheld monitoring device on your abdomen to check your baby's heartbeat and to check for contractions.  Placing monitors on your abdomen (external monitors) to record your baby's heartbeat and the frequency and length of contractions.  Placing monitors inside your uterus through your vagina (internal monitors) to record your baby's heartbeat and the frequency, length, and strength of your contractions. Depending on the type of monitor, it may remain in your uterus or on your baby's head until birth.  Telemetry. This is a type of continuous monitoring that can be done with external or internal monitors. Instead of having to stay in bed, you are able to move around during telemetry. Physical exam Your health care provider may perform frequent physical exams. This may include:  Checking how and where your baby is positioned in your uterus.  Checking your cervix to determine: ? Whether it is thinning out (effacing). ? Whether it is opening up (dilating). What happens during labor and delivery?  Normal labor and delivery is divided into the following three stages: Stage 1  This is the longest stage of labor.  This stage can last for hours or days.  Throughout this stage, you will feel contractions. Contractions generally feel mild, infrequent, and irregular at first. They get stronger, more frequent (about every 2-3 minutes), and more regular as you  move through this stage.  This stage ends when your cervix is completely dilated to 4 inches (10 cm) and completely effaced. Stage 2  This stage starts once your cervix is completely effaced and dilated and lasts until the delivery of your baby.  This stage may last from 20 minutes to 2 hours.  This is the stage where you will feel an urge to push your baby out of your vagina.  You may feel stretching and burning pain, especially when the widest part of your baby's head passes through the vaginal opening (crowning).  Once your baby is delivered, the umbilical cord will be clamped and cut. This usually occurs after waiting a period of 1-2 minutes after delivery.  Your baby will be placed on your bare chest (skin-to-skin contact) in an upright position and covered with a warm blanket. Watch your baby for feeding cues, like rooting or sucking, and help the baby to your breast for his or her first  feeding. Stage 3  This stage starts immediately after the birth of your baby and ends after you deliver the placenta.  This stage may take anywhere from 5 to 30 minutes.  After your baby has been delivered, you will feel contractions as your body expels the placenta and your uterus contracts to control bleeding. What can I expect after labor and delivery?  After labor is over, you and your baby will be monitored closely until you are ready to go home to ensure that you are both healthy. Your health care team will teach you how to care for yourself and your baby.  You and your baby will stay in the same room (rooming in) during your hospital stay. This will encourage early bonding and successful breastfeeding.  You may continue to receive fluids and medicines through an IV.  Your uterus will be checked and massaged regularly (fundal massage).  You will have some soreness and pain in your abdomen, vagina, and the area of skin between your vaginal opening and your anus (perineum).  If an  incision was made near your vagina (episiotomy) or if you had some vaginal tearing during delivery, cold compresses may be placed on your episiotomy or your tear. This helps to reduce pain and swelling.  You may be given a squirt bottle to use instead of wiping when you go to the bathroom. To use the squirt bottle, follow these steps: ? Before you urinate, fill the squirt bottle with warm water. Do not use hot water. ? After you urinate, while you are sitting on the toilet, use the squirt bottle to rinse the area around your urethra and vaginal opening. This rinses away any urine and blood. ? Fill the squirt bottle with clean water every time you use the bathroom.  It is normal to have vaginal bleeding after delivery. Wear a sanitary pad for vaginal bleeding and discharge. Summary  Vaginal delivery means that you will give birth by pushing your baby out of your birth canal (vagina).  Your health care provider may monitor your contractions (uterine monitoring) and your baby's heart rate (fetal monitoring).  Your health care provider may perform a physical exam.  Normal labor and delivery is divided into three stages.  After labor is over, you and your baby will be monitored closely until you are ready to go home. This information is not intended to replace advice given to you by your health care provider. Make sure you discuss any questions you have with your health care provider. Document Released: 08/04/2008 Document Revised: 11/30/2017 Document Reviewed: 11/30/2017 Elsevier Patient Education  [place]. Pain Relief During Labor and Delivery Many things can cause pain during labor and delivery, including:  Pressure on bones and ligaments due to the baby moving through the pelvis.  Stretching of tissues due to the baby moving through the birth canal.  Muscle tension due to anxiety or nervousness.  The uterus tightening (contracting) and relaxing to help move the baby. There  are many ways to deal with the pain of labor and delivery. They include:  Taking prenatal classes. Taking these classes helps you know what to expect during your baby's birth. What you learn will increase your confidence and decrease your anxiety.  Practicing relaxation techniques or doing relaxing activities, such as: ? Focused breathing. ? Meditation. ? Visualization. ? Aroma therapy. ? Listening to your favorite music. ? Hypnosis.  Taking a warm shower or bath (hydrotherapy). This may: ? Provide comfort and relaxation. ? Lessen  your perception of pain. ? Decrease the amount of pain medicine needed. ? Decrease the length of labor.  Getting a massage or counterpressure on your back.  Applying warm packs or ice packs.  Changing positions often, moving around, or using a birthing ball.  Getting: ? Pain medicine through an IV or injection into a muscle. ? Pain medicine inserted into your spinal column. ? Injections of sterile water just under the skin on your lower back (intradermal injections). ? Laughing gas (nitrous oxide). Discuss your pain control options with your health care provider during your prenatal visits. Explore the options offered by your hospital or birth center. What kinds of medicine are available? There are two kinds of medicines that can be used to relieve pain during labor and delivery:  Analgesics. These medicines decrease pain without causing you to lose feeling or the ability to move your muscles.  Anesthetics. These medicines block feeling in the body and can decrease your ability to move freely. Both of these kinds of medicine can cause minor side effects, such as nausea, trouble concentrating, and sleepiness. They can also decrease the baby's heart rate before birth and affect the baby's breathing rate after birth. For this reason, health care providers are careful about when and how much medicine is given. What are specific medicines and procedures that  provide pain relief? Local Anesthetics Local anesthetics are used to numb a small area of the body. They may be used along with another kind of anesthetic or used to numb the nerves of the vagina, cervix, and perineum during the second stage of labor. General Anesthetics General anesthetics cause you to lose consciousness so you do not feel pain. They are usually only used for an emergency cesarean delivery. General anesthetics are given through an IV tube and a mask. Pudendal Block A pudendal block is a form of local anesthetic. It may be used to relieve the pain associated with pushing or stretching of the perineum at the time of delivery or to further numb the perineum. A pudendal block is done by injecting numbing medicine through the vaginal wall into a nerve in the pelvis. Epidural Analgesia Epidural analgesia is given through a flexible IV catheter that is inserted into the lower back. Numbing medicine is delivered continuously to the area near your spinal column nerves (epidural space). After having this type of analgesia, you may be able to move your legs but you most likely will not be able to walk. Depending on the amount of medicine given, you may lose all feeling in the lower half of your body, or you may retain some level of sensation, including the urge to push. Epidural analgesia can be used to provide pain relief for a vaginal birth. Spinal Block A spinal block is similar to epidural analgesia, but the medicine is injected into the spinal fluid instead of the epidural space. A spinal block is only given once. It starts to relieve pain quickly, but the pain relief lasts only 1-6 hours. Spinal blocks can be used for cesarean deliveries. Combined Spinal-Epidural (CSE) Block A CSE block combines the effects of a spinal block and epidural analgesia. The spinal block works quickly to block all pain. The epidural analgesia provides continuous pain relief, even after the effects of the spinal  block have worn off. This information is not intended to replace advice given to you by your health care provider. Make sure you discuss any questions you have with your health care provider. Document Released: 02/11/2009 Document  Revised: 10/08/2017 Document Reviewed: 03/18/2016 Elsevier Patient Education  [place].

## 2019-09-15 NOTE — Progress Notes (Signed)
ROB-Doing well, no questions or concerns. 28 week labs today, see orders. Third trimester handouts provided. Reviewed red flag symptoms and when to call. RTC x 2 wks for ROB or sooner if needed.

## 2019-09-16 LAB — CBC
Hematocrit: 31.9 % — ABNORMAL LOW (ref 34.0–46.6)
Hemoglobin: 10.8 g/dL — ABNORMAL LOW (ref 11.1–15.9)
MCH: 30.6 pg (ref 26.6–33.0)
MCHC: 33.9 g/dL (ref 31.5–35.7)
MCV: 90 fL (ref 79–97)
Platelets: 192 10*3/uL (ref 150–450)
RBC: 3.53 x10E6/uL — ABNORMAL LOW (ref 3.77–5.28)
RDW: 11.8 % (ref 11.7–15.4)
WBC: 7.4 10*3/uL (ref 3.4–10.8)

## 2019-09-16 LAB — RPR: RPR Ser Ql: NONREACTIVE

## 2019-09-16 LAB — GLUCOSE, 1 HOUR GESTATIONAL: Gestational Diabetes Screen: 92 mg/dL (ref 65–139)

## 2019-09-18 ENCOUNTER — Encounter: Payer: Self-pay | Admitting: Certified Nurse Midwife

## 2019-09-18 DIAGNOSIS — O99019 Anemia complicating pregnancy, unspecified trimester: Secondary | ICD-10-CM | POA: Insufficient documentation

## 2019-09-26 ENCOUNTER — Observation Stay
Admission: EM | Admit: 2019-09-26 | Discharge: 2019-09-26 | Disposition: A | Discharge status: Home / Self Care | Payer: 59 | Attending: Certified Nurse Midwife | Admitting: Certified Nurse Midwife

## 2019-09-26 DIAGNOSIS — R1030 Lower abdominal pain, unspecified: Secondary | ICD-10-CM | POA: Insufficient documentation

## 2019-09-26 DIAGNOSIS — Z3A32 32 weeks gestation of pregnancy: Secondary | ICD-10-CM | POA: Diagnosis not present

## 2019-09-26 DIAGNOSIS — O26893 Other specified pregnancy related conditions, third trimester: Principal | ICD-10-CM | POA: Insufficient documentation

## 2019-09-26 DIAGNOSIS — R109 Unspecified abdominal pain: Secondary | ICD-10-CM | POA: Diagnosis not present

## 2019-09-26 DIAGNOSIS — M549 Dorsalgia, unspecified: Secondary | ICD-10-CM | POA: Insufficient documentation

## 2019-09-26 LAB — FETAL FIBRONECTIN: Fetal Fibronectin: NEGATIVE

## 2019-09-26 NOTE — OB Triage Note (Signed)
Pt given discharge instructions and also instructions for round ligament pain and PTL. Instructed to call [facility] if she begins to experience symptoms again per A Grandville Silos CNM

## 2019-09-26 NOTE — OB Triage Note (Signed)
    L&D OB Triage Note  SUBJECTIVE Wendy Castro is a 23 y.o. G2P1001 female at [redacted]w[redacted]d, [place] Estimated Date of Delivery: 11/19/19 who presented to triage with complaints of back pain and lower abdominal pain since 0930 this morning. Denies leaking of fluid, vaginal bleeding, and feels good movement.   OB History  Gravida Para Term Preterm AB Living  2 1 1  0 0 1  SAB TAB Ectopic Multiple Live Births  0 0 0 0 1    # Outcome Date GA Lbr Len/2nd Weight Sex Delivery Anes PTL Lv  2 Current           1 Term 07/31/17   3884 g M Vag-Spont EPI N LIV     Name: Jaxon    Medications Prior to Admission  Medication Sig Dispense Refill Last Dose  . ferrous sulfate 325 (65 FE) MG EC tablet Take 325 mg by mouth 3 (three) times daily with meals.     . Prenatal Vit-Fe Fumarate-FA (MULTIVITAMIN-PRENATAL) 27-0.8 MG TABS tablet Take 1 tablet by mouth daily at 12 noon.   09/25/2019 at Unknown time     OBJECTIVE  Nursing Evaluation:   Temp 98.5 F (36.9 C) (Oral)   Resp 20   LMP 02/14/2019 (Exact Date)    Findings:  Reactive NST  NST was performed and has been reviewed by me.  NST INTERPRETATION: Category I  Mode: External Baseline Rate (A): 145 bpm Variability: Moderate Accelerations: 10 x 10 Decelerations: None     Contraction Frequency (min): 1(since monitor applied)  ASSESSMENT Impression:  1.  Pregnancy:  G2P1001 at [redacted]w[redacted]d , [facility] Estimated Date of Delivery: 11/19/19 2.  NST:  Category I  3. FFN negative, cervix closed  PLAN 1. Reassurance given 2. Discharge home with standard labor precautions given to return to L&D or call the office for problems. 3. Continue routine prenatal care.  Philip Aspen, CNM

## 2019-09-26 NOTE — OB Triage Note (Signed)
Pt states that when she woke up this morning she started feeling contractions.around 930 or 10am.  States lower back pain also. Denies any bleeding or leaking of fluid. EFM applied.

## 2019-09-28 ENCOUNTER — Encounter (HOSPITAL_COMMUNITY): Payer: Self-pay

## 2019-10-02 ENCOUNTER — Encounter: Payer: 59 | Admitting: Certified Nurse Midwife

## 2019-10-10 ENCOUNTER — Other Ambulatory Visit: Payer: Self-pay

## 2019-10-10 ENCOUNTER — Ambulatory Visit (INDEPENDENT_AMBULATORY_CARE_PROVIDER_SITE_OTHER): Payer: 59 | Admitting: Certified Nurse Midwife

## 2019-10-10 VITALS — BP 88/57 | HR 81 | Wt 155.1 lb

## 2019-10-10 DIAGNOSIS — Z3483 Encounter for supervision of other normal pregnancy, third trimester: Secondary | ICD-10-CM

## 2019-10-10 LAB — POCT URINALYSIS DIPSTICK OB
Bilirubin, UA: NEGATIVE
Blood, UA: NEGATIVE
Glucose, UA: NEGATIVE
Ketones, UA: NEGATIVE
Leukocytes, UA: NEGATIVE
Nitrite, UA: NEGATIVE
POC,PROTEIN,UA: NEGATIVE
Spec Grav, UA: 1.02 (ref 1.010–1.025)
Urobilinogen, UA: 0.2 E.U./dL
pH, UA: 5 (ref 5.0–8.0)

## 2019-10-10 NOTE — Progress Notes (Signed)
ROB doing well. Feels good fetal movement. Has pressure. Reviewed PTL precautions. Disccused staffing changes/ she verbalizes understanding. Pt has history of big baby  (8lbs 9oz) and vacuum assisted delivery. Will do growth u/s @ 38 wks. . Follow up 2 wks for GBS and cultures.   Philip Aspen, CNM

## 2019-10-10 NOTE — Addendum Note (Signed)
Addended by: Raliegh Ip on: 10/10/2019 04:18 PM   Modules accepted: Orders

## 2019-10-10 NOTE — Patient Instructions (Signed)
Glucose Tolerance Test During Pregnancy Why am I having this test? The glucose tolerance test (GTT) is done to check how your body processes sugar (glucose). This is one of several tests used to diagnose diabetes that develops during pregnancy (gestational diabetes mellitus). Gestational diabetes is a temporary form of diabetes that some women develop during pregnancy. It usually occurs during the second trimester of pregnancy and goes away after delivery. Testing (screening) for gestational diabetes usually occurs between 24 and 28 weeks of pregnancy. You may have the GTT test after having a 1-hour glucose screening test if the results from that test indicate that you may have gestational diabetes. You may also have this test if:  You have a history of gestational diabetes.  You have a history of giving birth to very large babies or have experienced repeated fetal loss (stillbirth).  You have signs and symptoms of diabetes, such as: ? Changes in your vision. ? Tingling or numbness in your hands or feet. ? Changes in hunger, thirst, and urination that are not otherwise explained by your pregnancy. What is being tested? This test measures the amount of glucose in your blood at different times during a period of 3 hours. This indicates how well your body is able to process glucose. What kind of sample is taken?  Blood samples are required for this test. They are usually collected by inserting a needle into a blood vessel. How do I prepare for this test?  For 3 days before your test, eat normally. Have plenty of carbohydrate-rich foods.  Follow instructions from your health care provider about: ? Eating or drinking restrictions on the day of the test. You may be asked to not eat or drink anything other than water (fast) starting 8-10 hours before the test. ? Changing or stopping your regular medicines. Some medicines may interfere with this test. Tell a health care provider about:  All  medicines you are taking, including vitamins, herbs, eye drops, creams, and over-the-counter medicines.  Any blood disorders you have.  Any surgeries you have had.  Any medical conditions you have. What happens during the test? First, your blood glucose will be measured. This is referred to as your fasting blood glucose, since you fasted before the test. Then, you will drink a glucose solution that contains a certain amount of glucose. Your blood glucose will be measured again 1, 2, and 3 hours after drinking the solution. This test takes about 3 hours to complete. You will need to stay at the testing location during this time. During the testing period:  Do not eat or drink anything other than the glucose solution.  Do not exercise.  Do not use any products that contain nicotine or tobacco, such as cigarettes and e-cigarettes. If you need help stopping, ask your health care provider. The testing procedure may vary among health care providers and hospitals. How are the results reported? Your results will be reported as milligrams of glucose per deciliter of blood (mg/dL) or millimoles per liter (mmol/L). Your health care provider will compare your results to normal ranges that were established after testing a large group of people (reference ranges). Reference ranges may vary among labs and hospitals. For this test, common reference ranges are:  Fasting: less than 95-105 mg/dL (5.3-5.8 mmol/L).  1 hour after drinking glucose: less than 180-190 mg/dL (10.0-10.5 mmol/L).  2 hours after drinking glucose: less than 155-165 mg/dL (8.6-9.2 mmol/L).  3 hours after drinking glucose: 140-145 mg/dL (7.8-8.1 mmol/L). What do the   results mean? Results within reference ranges are considered normal, meaning that your glucose levels are well-controlled. If two or more of your blood glucose levels are high, you may be diagnosed with gestational diabetes. If only one level is high, your health care  provider may suggest repeat testing or other tests to confirm a diagnosis. Talk with your health care provider about what your results mean. Questions to ask your health care provider Ask your health care provider, or the department that is doing the test:  When will my results be ready?  How will I get my results?  What are my treatment options?  What other tests do I need?  What are my next steps? Summary  The glucose tolerance test (GTT) is one of several tests used to diagnose diabetes that develops during pregnancy (gestational diabetes mellitus). Gestational diabetes is a temporary form of diabetes that some women develop during pregnancy.  You may have the GTT test after having a 1-hour glucose screening test if the results from that test indicate that you may have gestational diabetes. You may also have this test if you have any symptoms or risk factors for gestational diabetes.  Talk with your health care provider about what your results mean. This information is not intended to replace advice given to you by your health care provider. Make sure you discuss any questions you have with your health care provider. Document Released: 04/26/2012 Document Revised: 02/16/2019 Document Reviewed: 06/07/2017 Elsevier Patient Education  2020 Elsevier Inc.  

## 2019-10-24 ENCOUNTER — Telehealth: Payer: Self-pay

## 2019-10-24 NOTE — Telephone Encounter (Signed)
Pt called stating contractions over an hour and back pain. Per Dr. Amalia Hailey instructed pt to go o the hospital.

## 2019-10-30 ENCOUNTER — Observation Stay
Admission: EM | Admit: 2019-10-30 | Discharge: 2019-10-31 | Disposition: A | Discharge status: Home / Self Care | Payer: 59 | Attending: Obstetrics and Gynecology | Admitting: Obstetrics and Gynecology

## 2019-10-30 ENCOUNTER — Ambulatory Visit (INDEPENDENT_AMBULATORY_CARE_PROVIDER_SITE_OTHER): Payer: 59 | Admitting: Certified Nurse Midwife

## 2019-10-30 ENCOUNTER — Other Ambulatory Visit: Payer: Self-pay

## 2019-10-30 ENCOUNTER — Encounter: Payer: Self-pay | Admitting: Obstetrics and Gynecology

## 2019-10-30 VITALS — BP 98/61 | HR 102 | Wt 159.4 lb

## 2019-10-30 DIAGNOSIS — Z3A37 37 weeks gestation of pregnancy: Secondary | ICD-10-CM | POA: Insufficient documentation

## 2019-10-30 DIAGNOSIS — Z3493 Encounter for supervision of normal pregnancy, unspecified, third trimester: Principal | ICD-10-CM | POA: Insufficient documentation

## 2019-10-30 DIAGNOSIS — Z3483 Encounter for supervision of other normal pregnancy, third trimester: Secondary | ICD-10-CM

## 2019-10-30 LAB — POCT URINALYSIS DIPSTICK OB
Bilirubin, UA: NEGATIVE
Blood, UA: NEGATIVE
Glucose, UA: NEGATIVE
Ketones, UA: NEGATIVE
Leukocytes, UA: NEGATIVE
Nitrite, UA: NEGATIVE
POC,PROTEIN,UA: NEGATIVE
Spec Grav, UA: 1.025 (ref 1.010–1.025)
Urobilinogen, UA: 0.2 U/dL
pH, UA: 6.5 (ref 5.0–8.0)

## 2019-10-30 LAB — OB RESULTS CONSOLE GBS: GBS: NEGATIVE

## 2019-10-30 LAB — OB RESULTS CONSOLE GC/CHLAMYDIA: Gonorrhea: NEGATIVE

## 2019-10-30 LAB — RUPTURE OF MEMBRANE (ROM)PLUS: Rom Plus: NEGATIVE

## 2019-10-30 NOTE — OB Triage Note (Signed)
Pt is a 23y/o G2P1 at [redacted]w[redacted]d with c/o LOF that began about 1630-1700. Pt states +FM. Pt denies ctx and VB. Pt states she was checked in the office and 4/70. Pt states feeling some back pressure. Monitors applied and assessing. Initial FHT 145.

## 2019-10-30 NOTE — Progress Notes (Signed)
ROB doing well. Feels good movement. GBS and cultures today. Pt interested in induction if possible. Discussed not recommended with out medical indication before 40 wks. She verbalizes understanding. U/s next visit for growth due to hx big baby with vacuum delivery. Follow up 1 wk.   Philip Aspen, CNM

## 2019-10-30 NOTE — Addendum Note (Signed)
Addended by: Raliegh Ip on: 10/30/2019 03:05 PM   Modules accepted: Orders

## 2019-10-30 NOTE — Patient Instructions (Signed)
Braxton Hicks Contractions Contractions of the uterus can occur throughout pregnancy, but they are not always a sign that you are in labor. You may have practice contractions called Braxton Hicks contractions. These false labor contractions are sometimes confused with true labor. What are Braxton Hicks contractions? Braxton Hicks contractions are tightening movements that occur in the muscles of the uterus before labor. Unlike true labor contractions, these contractions do not result in opening (dilation) and thinning of the cervix. Toward the end of pregnancy (32-34 weeks), Braxton Hicks contractions can happen more often and may become stronger. These contractions are sometimes difficult to tell apart from true labor because they can be very uncomfortable. You should not feel embarrassed if you go to the hospital with false labor. Sometimes, the only way to tell if you are in true labor is for your health care provider to look for changes in the cervix. The health care provider will do a physical exam and may monitor your contractions. If you are not in true labor, the exam should show that your cervix is not dilating and your water has not broken. If there are no other health problems associated with your pregnancy, it is completely safe for you to be sent home with false labor. You may continue to have Braxton Hicks contractions until you go into true labor. How to tell the difference between true labor and false labor True labor  Contractions last 30-70 seconds.  Contractions become very regular.  Discomfort is usually felt in the top of the uterus, and it spreads to the lower abdomen and low back.  Contractions do not go away with walking.  Contractions usually become more intense and increase in frequency.  The cervix dilates and gets thinner. False labor  Contractions are usually shorter and not as strong as true labor contractions.  Contractions are usually irregular.  Contractions  are often felt in the front of the lower abdomen and in the groin.  Contractions may go away when you walk around or change positions while lying down.  Contractions get weaker and are shorter-lasting as time goes on.  The cervix usually does not dilate or become thin. Follow these instructions at home:   Take over-the-counter and prescription medicines only as told by your health care provider.  Keep up with your usual exercises and follow other instructions from your health care provider.  Eat and drink lightly if you think you are going into labor.  If Braxton Hicks contractions are making you uncomfortable: ? Change your position from lying down or resting to walking, or change from walking to resting. ? Sit and rest in a tub of warm water. ? Drink enough fluid to keep your urine pale yellow. Dehydration may cause these contractions. ? Do slow and deep breathing several times an hour.  Keep all follow-up prenatal visits as told by your health care provider. This is important. Contact a health care provider if:  You have a fever.  You have continuous pain in your abdomen. Get help right away if:  Your contractions become stronger, more regular, and closer together.  You have fluid leaking or gushing from your vagina.  You pass blood-tinged mucus (bloody show).  You have bleeding from your vagina.  You have low back pain that you never had before.  You feel your baby's head pushing down and causing pelvic pressure.  Your baby is not moving inside you as much as it used to. Summary  Contractions that occur before labor are   called Braxton Hicks contractions, false labor, or practice contractions.  Braxton Hicks contractions are usually shorter, weaker, farther apart, and less regular than true labor contractions. True labor contractions usually become progressively stronger and regular, and they become more frequent.  Manage discomfort from Braxton Hicks contractions  by changing position, resting in a warm bath, drinking plenty of water, or practicing deep breathing. This information is not intended to replace advice given to you by your health care provider. Make sure you discuss any questions you have with your health care provider. Document Released: 03/11/2017 Document Revised: 10/08/2017 Document Reviewed: 03/11/2017 Elsevier Patient Education  2020 Elsevier Inc.  

## 2019-10-31 ENCOUNTER — Observation Stay
Admission: EM | Admit: 2019-10-31 | Discharge: 2019-10-31 | Disposition: A | Discharge status: Home / Self Care | Payer: 59 | Source: Home / Self Care | Admitting: Certified Nurse Midwife

## 2019-10-31 ENCOUNTER — Telehealth: Payer: Self-pay | Admitting: Certified Nurse Midwife

## 2019-10-31 ENCOUNTER — Encounter: Payer: Self-pay | Admitting: Obstetrics and Gynecology

## 2019-10-31 DIAGNOSIS — Z3A37 37 weeks gestation of pregnancy: Secondary | ICD-10-CM

## 2019-10-31 DIAGNOSIS — Z3493 Encounter for supervision of normal pregnancy, unspecified, third trimester: Secondary | ICD-10-CM | POA: Diagnosis not present

## 2019-10-31 DIAGNOSIS — N898 Other specified noninflammatory disorders of vagina: Secondary | ICD-10-CM | POA: Diagnosis not present

## 2019-10-31 DIAGNOSIS — O26893 Other specified pregnancy related conditions, third trimester: Secondary | ICD-10-CM

## 2019-10-31 MED ORDER — OXYCODONE-ACETAMINOPHEN 5-325 MG PO TABS
1.0000 | ORAL_TABLET | Freq: Once | ORAL | Status: AC
  Administered 2019-10-31: 1 via ORAL
  Filled 2019-10-31: qty 1

## 2019-10-31 NOTE — OB Triage Note (Signed)
Patient came in for observation for labor evaluation. Patient reports occasional uterine contractions and pelvic pressure since 1100.Marland Kitchen Patient rates pain 8/10. Patient denies leaking of fluid and denies vaginal bleeding and spotting. Vital signs stable and patient afebrile. FHR baseline 140 with moderate variability with accelerations 15 x 15 and no decelerations. Husband at bedside. Will continue to monitor.

## 2019-10-31 NOTE — Progress Notes (Signed)
Pt to be d/c home and to self care. Pt given teaching on when to seek medical attention (LOF, VB and decreased FM, etc..). Pt verbalized understanding of d/c instructions.   ?

## 2019-10-31 NOTE — Discharge Summary (Signed)
    L&D OB Triage Note  SUBJECTIVE Wendy Castro is a 23 y.o. G2P1001 female at [redacted]w[redacted]d, [place] Estimated Date of Delivery: 11/19/19 who presented to triage with complaints of leakage of fluid.  Patient was checked in the office today and has noted some discharge throughout the day and is concerned her water broke.  Denies bleeding complains of occasional contractions.   OB History  Gravida Para Term Preterm AB Living  2 1 1  0 0 1  SAB TAB Ectopic Multiple Live Births  0 0 0 0 1    # Outcome Date GA Lbr Len/2nd Weight Sex Delivery Anes PTL Lv  2 Current           1 Term 07/31/17   3884 g M Vag-Spont EPI N LIV     Name: Jaxon    No medications prior to admission.     OBJECTIVE  Nursing Evaluation:   BP 94/60 (BP Location: Left Arm)   Temp 97.6 F (36.4 C) (Oral)   Resp 18   Ht 5\' 4"  (1.626 m)   Wt 72.1 kg   LMP 02/14/2019 (Exact Date)   BMI 27.29 kg/m    Findings:   ROM plus negative-occasional contractions-no evidence of active labor based on cervical change.  NST was performed and has been reviewed by me.  NST INTERPRETATION: Category I  Mode: External Baseline Rate (A): 135 bpm Variability: Moderate Accelerations: 10 x 10 Decelerations: None     Contraction Frequency (min): 2-3  ASSESSMENT Impression:  1.  Pregnancy:  G2P1001 at [redacted]w[redacted]d , [place] Estimated Date of Delivery: 11/19/19 2.  NST:  Category I  PLAN 1. Reassurance given 2. Discharge home with standard labor precautions given to return to L&D or call the office for problems. 3. Continue routine prenatal care.

## 2019-10-31 NOTE — Discharge Instructions (Signed)
First Stage of Labor °Labor is your body's natural process of moving your baby and other structures, including the placenta and umbilical cord, out of your uterus. There are three stages of labor. How long each stage lasts is different for every woman. But certain events happen during each stage that are the same for everyone. °· The first stage starts when true labor begins. This stage ends when your cervix, which is the opening from your uterus into your vagina, is completely open (dilated). °· The second stage begins when your cervix is fully dilated and you start pushing. This stage ends when your baby is born. °· The third stage is the delivery of the organ that nourished your baby during pregnancy (placenta). °First stage of labor °As your due date gets closer, you may start to notice certain physical changes that mean labor is going to start soon. You may feel that your baby has dropped lower into your pelvis. You may experience irregular, often painless, contractions that go away when you walk around or lie down (Braxton Hicks contractions). This is also called false labor. °The first stage of labor begins when you start having contractions that come at regular (evenly spaced) intervals and your cervix starts to get thinner and wider in preparation for your baby to pass through. Birth care providers measure the dilation of your cervix in centimeters (cm). One centimeter is a little less than one-half of an inch. The first stage ends when your cervix is dilated to 10 cm. The first stage of labor is divided into three phases: °· Early phase. °· Active phase. °· Transitional phase. °The length of the first stage of labor varies. It may be longer if this is your first pregnancy. You may spend most of this stage at home trying to relax and stay comfortable. °How does this affect me? °During the first stage of labor, you will move through three phases. °What happens in the early phase? °· You will start to have  regular contractions that last 30-60 seconds. Contractions may come every 5-20 minutes. Keep track of your contractions and call your birth care provider. °· Your water may break during this phase. °· You may notice a clear or slightly bloody discharge of mucus (mucus plug) from your vagina. °· Your cervix will dilate to 3-6 cm. °What happens in the active phase? °The active phase usually lasts 3-5 hours. You may go to the hospital or birth center around this time. During the active phase: °· Your contractions will become stronger, longer, and more uncomfortable. °· Your contractions may last 45-90 seconds and come every 3-5 minutes. °· You may feel lower back pain. °· Your birth care providers may examine your cervix and feel your belly to find the position of your baby. °· You may have a monitor strapped to your belly to measure your contractions and your baby's heart rate. °· You may start using your pain management options. °· Your cervix may be dilated to 6 cm and may start to dilate more quickly. °What happens in the transitional phase? °The transitional phase typically lasts from 30 minutes to 2 hours. At the end of this phase, your cervix will be fully dilated to 10 cm. During the transitional phase: °· Contractions will get stronger and longer. °· Contractions may last 60-90 seconds and come less than 2 minutes apart. °· You may feel hot flashes, chills, or nausea. °How does this affect my baby? °During the first stage of labor, your baby will   gradually move down into your birth canal. °Follow these instructions at home and in the hospital or birth center: ° °· When labor first begins, try to stay calm. You are still in the early phase. If it is night, try to get some sleep. If it is day, try to relax and save your energy. You may want to make some calls and get ready to go to the hospital or birth center. °· When you are in the early phase, try these methods to help ease discomfort: °? Deep breathing and  muscle relaxation. °? Taking a walk. °? Taking a warm bath or shower. °· Drink some fluids and have a light snack if you feel like it. °· Keep track of your contractions. °· Based on the plan you created with your birth care provider, call when your contractions indicate it is time. °· If your water breaks, note the time, color, and odor of the fluid. °· When you are in the active phase, do your breathing exercises and rely on your support people and your team of birth care providers. °Contact a health care provider if: °· Your contractions are strong and regular. °· You have lower back pain or cramping. °· Your water breaks. °· You lose your mucus plug. °Get help right away if you: °· Have a severe headache that does not go away. °· Have changes in your vision. °· Have severe pain in your upper belly. °· Do not feel the baby move. °· Have bright red bleeding. °Summary °· The first stage of labor starts when true labor begins, and it ends when your cervix is dilated to 10 cm. °· The first stage of labor has three phases: early, active, and transitional. °· Your baby moves into the birth canal during the first stage of labor. °· You may have contractions that become stronger and longer. You may also lose your mucus plug and have your water break. °· Call your birth care provider when your contractions are frequent and strong enough to go to the hospital or birth center. °This information is not intended to replace advice given to you by your health care provider. Make sure you discuss any questions you have with your health care provider. °Document Released: 01/09/2018 Document Revised: 02/16/2019 Document Reviewed: 01/09/2018 °Elsevier Patient Education © 2020 [employer]. ° °

## 2019-10-31 NOTE — Telephone Encounter (Signed)
Pt called in with some pelvic pain. And slight cramping. Please advise.

## 2019-10-31 NOTE — OB Triage Note (Signed)
    L&D OB Triage Note  SUBJECTIVE Wendy Castro is a 23 y.o. G2P1001 female at [redacted]w[redacted]d, EDD Estimated Date of Delivery: 11/19/19 who presented to triage with complaints of pelvic pressure/pain and irregular contractions. She feels good movement and denies LOF and vaginal bleeding.   OB History  Gravida Para Term Preterm AB Living  2 1 1  0 0 1  SAB TAB Ectopic Multiple Live Births  0 0 0 0 1    # Outcome Date GA Lbr Len/2nd Weight Sex Delivery Anes PTL Lv  2 Current           1 Term 07/31/17   3884 g M Vag-Spont EPI N LIV     Name: Jaxon    Medications Prior to Admission  Medication Sig Dispense Refill Last Dose  . ferrous sulfate 325 (65 FE) MG EC tablet Take 325 mg by mouth 3 (three) times daily with meals.   10/31/2019 at Unknown time  . Prenatal Vit-Fe Fumarate-FA (MULTIVITAMIN-PRENATAL) 27-0.8 MG TABS tablet Take 1 tablet by mouth daily at 12 noon.   10/31/2019 at Unknown time     OBJECTIVE  Nursing Evaluation:   BP (!) 93/58 (BP Location: Left Arm)   Pulse (!) 102   Temp 97.9 F (36.6 C) (Oral)   Resp 19   Ht 5\' 4"  (1.626 m)   Wt 72.1 kg   LMP 02/14/2019 (Exact Date)   BMI 27.29 kg/m    Findings:   Reactive NST   NST was performed and has been reviewed by me.  NST INTERPRETATION: Category I  Mode: External Baseline Rate (A): 140 bpm Variability: Moderate Accelerations: 15 x 15 Decelerations: None     Contraction Frequency (min): occas  ASSESSMENT Impression:  1.  Pregnancy:  G2P1001 at [redacted]w[redacted]d , EDD Estimated Date of Delivery: 11/19/19 2.  NST:  Category I  3.Musculoskeletal discomfort of pregnancy 4. No cervical change  PLAN 1. Reassurance given 2. Discharge home with standard labor precautions given to return to L&D or call the office for problems. 3. Continue routine prenatal care.  Philip Aspen, CNM

## 2019-11-01 ENCOUNTER — Telehealth: Payer: Self-pay

## 2019-11-01 LAB — GC/CHLAMYDIA PROBE AMP
Chlamydia trachomatis, NAA: NEGATIVE
Neisseria Gonorrhoeae by PCR: NEGATIVE

## 2019-11-01 NOTE — Telephone Encounter (Signed)
mychart message sent to patient

## 2019-11-03 LAB — STREP GP B CULTURE+RFLX: Strep Gp B Culture+Rflx: NEGATIVE

## 2019-11-07 ENCOUNTER — Ambulatory Visit (INDEPENDENT_AMBULATORY_CARE_PROVIDER_SITE_OTHER): Payer: 59

## 2019-11-07 ENCOUNTER — Ambulatory Visit (INDEPENDENT_AMBULATORY_CARE_PROVIDER_SITE_OTHER): Payer: 59 | Admitting: Certified Nurse Midwife

## 2019-11-07 ENCOUNTER — Other Ambulatory Visit: Payer: Self-pay

## 2019-11-07 VITALS — BP 97/70 | HR 89 | Wt 160.4 lb

## 2019-11-07 DIAGNOSIS — Z3A38 38 weeks gestation of pregnancy: Secondary | ICD-10-CM

## 2019-11-07 DIAGNOSIS — Z3483 Encounter for supervision of other normal pregnancy, third trimester: Secondary | ICD-10-CM

## 2019-11-07 LAB — POCT URINALYSIS DIPSTICK OB
Bilirubin, UA: NEGATIVE
Blood, UA: NEGATIVE
Glucose, UA: NEGATIVE
Ketones, UA: NEGATIVE
Leukocytes, UA: NEGATIVE
Nitrite, UA: NEGATIVE
POC,PROTEIN,UA: NEGATIVE
Spec Grav, UA: 1.02 (ref 1.010–1.025)
Urobilinogen, UA: 0.2 E.U./dL
pH, UA: 5 (ref 5.0–8.0)

## 2019-11-07 NOTE — Progress Notes (Signed)
ROB doing well, uncomfortable. Feels good movement. Disucssed labor precautions. SVE 4-5/70/-2 vertex. Follow up 1 wk.   Philip Aspen, CNM

## 2019-11-07 NOTE — Patient Instructions (Signed)
Braxton Hicks Contractions Contractions of the uterus can occur throughout pregnancy, but they are not always a sign that you are in labor. You may have practice contractions called Braxton Hicks contractions. These false labor contractions are sometimes confused with true labor. What are Braxton Hicks contractions? Braxton Hicks contractions are tightening movements that occur in the muscles of the uterus before labor. Unlike true labor contractions, these contractions do not result in opening (dilation) and thinning of the cervix. Toward the end of pregnancy (32-34 weeks), Braxton Hicks contractions can happen more often and may become stronger. These contractions are sometimes difficult to tell apart from true labor because they can be very uncomfortable. You should not feel embarrassed if you go to the hospital with false labor. Sometimes, the only way to tell if you are in true labor is for your health care provider to look for changes in the cervix. The health care provider will do a physical exam and may monitor your contractions. If you are not in true labor, the exam should show that your cervix is not dilating and your water has not broken. If there are no other health problems associated with your pregnancy, it is completely safe for you to be sent home with false labor. You may continue to have Braxton Hicks contractions until you go into true labor. How to tell the difference between true labor and false labor True labor  Contractions last 30-70 seconds.  Contractions become very regular.  Discomfort is usually felt in the top of the uterus, and it spreads to the lower abdomen and low back.  Contractions do not go away with walking.  Contractions usually become more intense and increase in frequency.  The cervix dilates and gets thinner. False labor  Contractions are usually shorter and not as strong as true labor contractions.  Contractions are usually irregular.  Contractions  are often felt in the front of the lower abdomen and in the groin.  Contractions may go away when you walk around or change positions while lying down.  Contractions get weaker and are shorter-lasting as time goes on.  The cervix usually does not dilate or become thin. Follow these instructions at home:   Take over-the-counter and prescription medicines only as told by your health care provider.  Keep up with your usual exercises and follow other instructions from your health care provider.  Eat and drink lightly if you think you are going into labor.  If Braxton Hicks contractions are making you uncomfortable: ? Change your position from lying down or resting to walking, or change from walking to resting. ? Sit and rest in a tub of warm water. ? Drink enough fluid to keep your urine pale yellow. Dehydration may cause these contractions. ? Do slow and deep breathing several times an hour.  Keep all follow-up prenatal visits as told by your health care provider. This is important. Contact a health care provider if:  You have a fever.  You have continuous pain in your abdomen. Get help right away if:  Your contractions become stronger, more regular, and closer together.  You have fluid leaking or gushing from your vagina.  You pass blood-tinged mucus (bloody show).  You have bleeding from your vagina.  You have low back pain that you never had before.  You feel your baby's head pushing down and causing pelvic pressure.  Your baby is not moving inside you as much as it used to. Summary  Contractions that occur before labor are   called Braxton Hicks contractions, false labor, or practice contractions.  Braxton Hicks contractions are usually shorter, weaker, farther apart, and less regular than true labor contractions. True labor contractions usually become progressively stronger and regular, and they become more frequent.  Manage discomfort from Braxton Hicks contractions  by changing position, resting in a warm bath, drinking plenty of water, or practicing deep breathing. This information is not intended to replace advice given to you by your health care provider. Make sure you discuss any questions you have with your health care provider. Document Released: 03/11/2017 Document Revised: 10/08/2017 Document Reviewed: 03/11/2017 Elsevier Patient Education  2020 Elsevier Inc.  

## 2019-11-08 ENCOUNTER — Inpatient Hospital Stay: Payer: 59 | Admitting: Anesthesiology

## 2019-11-08 ENCOUNTER — Encounter: Payer: Self-pay | Admitting: Certified Nurse Midwife

## 2019-11-08 ENCOUNTER — Inpatient Hospital Stay
Admission: EM | Admit: 2019-11-08 | Discharge: 2019-11-10 | DRG: 807 | Disposition: A | Discharge status: Home / Self Care | Payer: 59 | Attending: Certified Nurse Midwife | Admitting: Certified Nurse Midwife

## 2019-11-08 DIAGNOSIS — D649 Anemia, unspecified: Secondary | ICD-10-CM | POA: Diagnosis present

## 2019-11-08 DIAGNOSIS — Z3A38 38 weeks gestation of pregnancy: Secondary | ICD-10-CM

## 2019-11-08 DIAGNOSIS — O9902 Anemia complicating childbirth: Principal | ICD-10-CM | POA: Diagnosis present

## 2019-11-08 DIAGNOSIS — W1839XA Other fall on same level, initial encounter: Secondary | ICD-10-CM | POA: Diagnosis present

## 2019-11-08 DIAGNOSIS — Y93K1 Activity, walking an animal: Secondary | ICD-10-CM | POA: Diagnosis not present

## 2019-11-08 DIAGNOSIS — Z20822 Contact with and (suspected) exposure to covid-19: Secondary | ICD-10-CM | POA: Diagnosis present

## 2019-11-08 DIAGNOSIS — O26893 Other specified pregnancy related conditions, third trimester: Secondary | ICD-10-CM | POA: Diagnosis present

## 2019-11-08 LAB — CBC
HCT: 33.1 % — ABNORMAL LOW (ref 36.0–46.0)
Hemoglobin: 10.7 g/dL — ABNORMAL LOW (ref 12.0–15.0)
MCH: 29.7 pg (ref 26.0–34.0)
MCHC: 32.3 g/dL (ref 30.0–36.0)
MCV: 91.9 fL (ref 80.0–100.0)
Platelets: 194 10*3/uL (ref 150–400)
RBC: 3.6 MIL/uL — ABNORMAL LOW (ref 3.87–5.11)
RDW: 13.4 % (ref 11.5–15.5)
WBC: 8.5 10*3/uL (ref 4.0–10.5)
nRBC: 0 % (ref 0.0–0.2)

## 2019-11-08 LAB — TYPE AND SCREEN
ABO/RH(D): B POS
Antibody Screen: NEGATIVE

## 2019-11-08 LAB — RESPIRATORY PANEL BY RT PCR (FLU A&B, COVID)
Influenza A by PCR: NEGATIVE
Influenza B by PCR: NEGATIVE
SARS Coronavirus 2 by RT PCR: NEGATIVE

## 2019-11-08 MED ORDER — ONDANSETRON HCL 4 MG/2ML IJ SOLN: 4.0000 mg | Freq: Four times a day (QID) | INTRAMUSCULAR | Status: DC | PRN

## 2019-11-08 MED ORDER — AMMONIA AROMATIC IN INHA
RESPIRATORY_TRACT | Status: AC
  Filled 2019-11-08: qty 10

## 2019-11-08 MED ORDER — ACETAMINOPHEN 325 MG PO TABS: 650.0000 mg | ORAL_TABLET | ORAL | Status: DC | PRN

## 2019-11-08 MED ORDER — BUTORPHANOL TARTRATE 1 MG/ML IJ SOLN
1.0000 mg | INTRAMUSCULAR | Status: DC | PRN
  Administered 2019-11-08: 1 mg via INTRAVENOUS
  Filled 2019-11-08: qty 1

## 2019-11-08 MED ORDER — PHENYLEPHRINE 40 MCG/ML (10ML) SYRINGE FOR IV PUSH (FOR BLOOD PRESSURE SUPPORT)
80.0000 ug | PREFILLED_SYRINGE | INTRAVENOUS | Status: DC | PRN
  Filled 2019-11-08: qty 10

## 2019-11-08 MED ORDER — OXYTOCIN BOLUS FROM INFUSION
500.0000 mL | Freq: Once | INTRAVENOUS | Status: AC
  Administered 2019-11-08: 23:00:00 500 mL via INTRAVENOUS

## 2019-11-08 MED ORDER — BUTORPHANOL TARTRATE 1 MG/ML IJ SOLN: 1.0000 mg | INTRAMUSCULAR | Status: DC | PRN

## 2019-11-08 MED ORDER — EPHEDRINE 5 MG/ML INJ
10.0000 mg | INTRAVENOUS | Status: DC | PRN
  Filled 2019-11-08: qty 2

## 2019-11-08 MED ORDER — FENTANYL 2.5 MCG/ML W/ROPIVACAINE 0.15% IN NS 100 ML EPIDURAL (ARMC)
EPIDURAL | Status: DC | PRN
  Administered 2019-11-08: 12 mL/h via EPIDURAL

## 2019-11-08 MED ORDER — FENTANYL 2.5 MCG/ML W/ROPIVACAINE 0.15% IN NS 100 ML EPIDURAL (ARMC): 12.0000 mL/h | EPIDURAL | Status: DC

## 2019-11-08 MED ORDER — LACTATED RINGERS IV SOLN: INTRAVENOUS | Status: DC

## 2019-11-08 MED ORDER — DIPHENHYDRAMINE HCL 50 MG/ML IJ SOLN: 12.5000 mg | INTRAMUSCULAR | Status: DC | PRN

## 2019-11-08 MED ORDER — FENTANYL 2.5 MCG/ML W/ROPIVACAINE 0.15% IN NS 100 ML EPIDURAL (ARMC)
EPIDURAL | Status: AC
  Filled 2019-11-08: qty 100

## 2019-11-08 MED ORDER — LIDOCAINE HCL (PF) 1 % IJ SOLN
30.0000 mL | INTRAMUSCULAR | Status: AC | PRN
  Administered 2019-11-08: 1.2 mL via SUBCUTANEOUS

## 2019-11-08 MED ORDER — LIDOCAINE HCL (PF) 1 % IJ SOLN: 30.0000 mL | INTRAMUSCULAR | Status: DC | PRN

## 2019-11-08 MED ORDER — LACTATED RINGERS IV SOLN: 500.0000 mL | INTRAVENOUS | Status: DC | PRN

## 2019-11-08 MED ORDER — SOD CITRATE-CITRIC ACID 500-334 MG/5ML PO SOLN: 30.0000 mL | ORAL | Status: DC | PRN

## 2019-11-08 MED ORDER — OXYTOCIN 10 UNIT/ML IJ SOLN
INTRAMUSCULAR | Status: AC
  Filled 2019-11-08: qty 2

## 2019-11-08 MED ORDER — LACTATED RINGERS IV SOLN: 500.0000 mL | Freq: Once | INTRAVENOUS | Status: DC

## 2019-11-08 MED ORDER — LIDOCAINE HCL (PF) 1 % IJ SOLN
INTRAMUSCULAR | Status: AC
  Filled 2019-11-08: qty 30

## 2019-11-08 MED ORDER — OXYTOCIN BOLUS FROM INFUSION: 500.0000 mL | Freq: Once | INTRAVENOUS | Status: DC

## 2019-11-08 MED ORDER — LIDOCAINE-EPINEPHRINE (PF) 1.5 %-1:200000 IJ SOLN
INTRAMUSCULAR | Status: DC | PRN
  Administered 2019-11-08: 3 mL via EPIDURAL

## 2019-11-08 MED ORDER — MISOPROSTOL 200 MCG PO TABS
ORAL_TABLET | ORAL | Status: AC
  Filled 2019-11-08: qty 4

## 2019-11-08 MED ORDER — OXYTOCIN 40 UNITS IN NORMAL SALINE INFUSION - SIMPLE MED
2.5000 [IU]/h | INTRAVENOUS | Status: DC
  Filled 2019-11-08: qty 1000

## 2019-11-08 NOTE — Progress Notes (Signed)
Patient presents to LD stating that she fell around 1100 this am while walking the dog. Patient states she fell on her right side, was able to catch herself with her arm but her abdomen did hit the ground.  Patient states the baby has been less active than usual after the event occurred.  Patient denies vaginal bleeding or leaking of fluid, patient reports a few mild contractions. Patient drank juice and ate a snack prior to coming to see if the baby would respond, patient states she didn't notice a difference after trying those interventions.

## 2019-11-08 NOTE — Anesthesia Procedure Notes (Signed)
Epidural Patient location during procedure: OB Start time: 11/08/2019 10:06 PM End time: 11/08/2019 10:30 PM  Staffing Performed: anesthesiologist   Preanesthetic Checklist Completed: patient identified, IV checked, site marked, risks and benefits discussed, surgical consent, monitors and equipment checked, pre-op evaluation and timeout performed  Epidural Patient position: sitting Prep: Betadine Patient monitoring: heart rate, continuous pulse ox and blood pressure Approach: midline Location: L4-L5 Injection technique: LOR saline  Needle:  Needle type: Tuohy  Needle gauge: 17 G Needle length: 9 cm and 9 Catheter type: closed end flexible Catheter size: 19 Gauge Catheter at skin depth: 12 cm Test dose: negative and 1.5% lidocaine with Epi 1:200 K  Assessment Events: blood not aspirated, injection not painful, no injection resistance, no paresthesia and negative IV test  Additional Notes   Patient tolerated the insertion well without complications.Reason for block:procedure for pain

## 2019-11-08 NOTE — Progress Notes (Signed)
LABOR NOTE   Wendy Castro 23 y.o.@ at [redacted]w[redacted]d Active phase labor.  SUBJECTIVE:  Breathing through contractions.  OBJECTIVE:  BP 122/81 (BP Location: Right Arm)   Pulse 94   Temp 97.9 F (36.6 C) (Oral)   Resp 20   Ht 5\' 4"  (1.626 m)   Wt 72.7 kg   LMP 02/14/2019 (Exact Date)   SpO2 98%   BMI 27.53 kg/m  No intake/output data recorded.  She has not shown cervical change. SVE:   Dilation: 8 Effacement (%): 70 Station: -2 Exam by:: A. Malvika Tung CONTRACTIONS: regular, every 2-4 minutes FHR: Fetal heart tracing reviewed. Baseline: 140 bpm, Variability: Good {> 6 bpm), Accelerations: Reactive and Decelerations: Absent Category I  Analgesia: Labor support without medications  Labs: Lab Results  Component Value Date   WBC 8.5 11/08/2019   HGB 10.7 (L) 11/08/2019   HCT 33.1 (L) 11/08/2019   MCV 91.9 11/08/2019   PLT 194 11/08/2019    ASSESSMENT: 1) Labor curve reviewed.       Progress: Active phase labor.     Membranes: ruptured, clear fluid          Active Problems:   Labor and delivery, indication for care   PLAN: continue present management   Philip Aspen, CNM. 11/08/2019 7:43 PM

## 2019-11-08 NOTE — Anesthesia Preprocedure Evaluation (Signed)
Anesthesia Evaluation  Patient identified by MRN, date of birth, ID band Patient awake    Reviewed: Allergy & Precautions, NPO status , Patient's Chart, lab work & pertinent test results  History of Anesthesia Complications Negative for: history of anesthetic complications  Airway Mallampati: II       Dental   Pulmonary neg sleep apnea, neg COPD, Not current smoker,           Cardiovascular (-) hypertension(-) Past MI and (-) CHF (-) dysrhythmias (-) Valvular Problems/Murmurs     Neuro/Psych neg Seizures Anxiety    GI/Hepatic Neg liver ROS, GERD (with pregnancy)  ,  Endo/Other  neg diabetes  Renal/GU negative Renal ROS     Musculoskeletal   Abdominal   Peds  Hematology  (+) anemia ,   Anesthesia Other Findings   Reproductive/Obstetrics (+) Pregnancy                             Anesthesia Physical Anesthesia Plan  ASA: II  Anesthesia Plan: Epidural   Post-op Pain Management:    Induction:   PONV Risk Score and Plan:   Airway Management Planned:   Additional Equipment:   Intra-op Plan:   Post-operative Plan:   Informed Consent: I have reviewed the patients History and Physical, chart, labs and discussed the procedure including the risks, benefits and alternatives for the proposed anesthesia with the patient or authorized representative who has indicated his/her understanding and acceptance.       Plan Discussed with:   Anesthesia Plan Comments:         Anesthesia Quick Evaluation

## 2019-11-08 NOTE — H&P (Signed)
History and Physical   HPI  Wendy Castro is a 23 y.o. G2P1001 at [redacted]w[redacted]d Estimated Date of Delivery: 11/19/19 who is being admitted for Labor, She fell earlier today. Her dog pulled her down. She complain of decrease fetal movement. She denied LOF & Vag bleeding. She states she feel on her right hip but slightly hit her abdomen.     OB History  OB History  Gravida Para Term Preterm AB Living  2 1 1  0 0 1  SAB TAB Ectopic Multiple Live Births  0 0 0 0 1    # Outcome Date GA Lbr Len/2nd Weight Sex Delivery Anes PTL Lv  2 Current           1 Term 07/31/17   3884 g M Vag-Spont EPI N LIV     Name: Jaxon    PROBLEM LIST  Pregnancy complications or risks: Patient Active Problem List   Diagnosis Date Noted  . Indication for care in labor or delivery 09/26/2019  . Labor and delivery, indication for care 09/26/2019  . Anemia in pregnancy 09/18/2019  . Lactating mother 03/21/2019    Prenatal labs and studies: ABO, Rh: B/Positive/-- (05/28 1057) Antibody: Negative (05/28 1057) Rubella: 4.80 (05/28 1057) RPR: Non Reactive (11/06 1051)  HBsAg: Negative (05/28 1057)  HIV:   ordered ONG:EXBMWUXL/-- (12/21 1534)   Past Medical History:  Diagnosis Date  . Anxiety      Past Surgical History:  Procedure Laterality Date  . WISDOM TOOTH EXTRACTION  2014     Medications    Current Discharge Medication List    CONTINUE these medications which have NOT CHANGED   Details  ferrous sulfate 325 (65 FE) MG EC tablet Take 325 mg by mouth 3 (three) times daily with meals.    Prenatal Vit-Fe Fumarate-FA (MULTIVITAMIN-PRENATAL) 27-0.8 MG TABS tablet Take 1 tablet by mouth daily at 12 noon.         Allergies  Amoxicillin  Review of Systems  Constitutional: negative Eyes: negative Ears, nose, mouth, throat, and face: negative Respiratory: negative Cardiovascular: negative Gastrointestinal: negative Genitourinary:negative Integument/breast:  negative Hematologic/lymphatic: negative Musculoskeletal:positive for right side pain from fall Neurological: negative Behavioral/Psych: negative Endocrine: negative Allergic/Immunologic: negative  Physical Exam  BP 98/69 (BP Location: Right Arm)   Pulse 93   Temp 98.3 F (36.8 C) (Oral)   Resp 18   Ht 5\' 4"  (1.626 m)   Wt 72.7 kg   LMP 02/14/2019 (Exact Date)   SpO2 98%   BMI 27.53 kg/m   Lungs:  CTA B Cardio: RRR  Abd: Soft, gravid, NT Presentation: cephalic EXT: No C/C/ 1+ Edema DTRs: 2+ B CERVIX: Dilation: 6 Effacement (%): 70 Station: -2 Exam by:: Philip Aspen, RN *  See Prenatal records for more detailed PE.     FHR:  Baseline: 140 bpm, Variability: Good {> 6 bpm), Accelerations: Reactive and Decelerations: Absent  Toco: Uterine Contractions: Frequency: Every 5-6 minutes, Duration: 40-60 seconds and Intensity: mild to moderate  Test Results  No results found for this or any previous visit (from the past 24 hour(s)). Group B Strep negative  Assessment   G2P1001 at [redacted]w[redacted]d Estimated Date of Delivery: 11/19/19  The fetus is reassuring.    Patient Active Problem List   Diagnosis Date Noted  . Indication for care in labor or delivery 09/26/2019  . Labor and delivery, indication for care 09/26/2019  . Anemia in pregnancy 09/18/2019  . Lactating mother 03/21/2019    Plan  1. Admit to L&D :   continue present management 2. EFM:-- Category 1 3. Epidural if desired.  Stadol for IV pain until epidural requested. 4. Admission labs   Doreene Burke, [place]  11/08/2019 4:33 PM

## 2019-11-08 NOTE — Progress Notes (Signed)
Notified A. Grandville Silos CNM of patient c/o and report.  CNM states to continue to monitor the patient for 2 hours patient may have regular diet.  RN to call CNM at 2 hours or sooner if needed.  RN discussed plan of care with patient, patient in agreement.

## 2019-11-08 NOTE — Progress Notes (Signed)
LABOR NOTE   Wendy Castro 23 y.o.@ at [redacted]w[redacted]d Active phase labor.  SUBJECTIVE:  Breathing with contractions OBJECTIVE:  BP 112/72 (BP Location: Right Arm)   Pulse (!) 101   Temp 97.9 F (36.6 C) (Oral)   Resp 20   Ht 5\' 4"  (1.626 m)   Wt 72.7 kg   LMP 02/14/2019 (Exact Date)   SpO2 98%   BMI 27.53 kg/m  No intake/output data recorded.  She has shown cervical change. CERVIX: 8cm:  80%:   -2:   mid position:   soft SVE:   Dilation: 8 Effacement (%): 70 Station: -2 Exam by:: Philip Aspen, CNM CONTRACTIONS: regular, every 2-6 minutes FHR: Fetal heart tracing reviewed. Baseline: 140 bpm, Variability: Good {> 6 bpm), Accelerations: Reactive and Decelerations: Absent Category I Analgesia: Labor support without medications  Labs: Lab Results  Component Value Date   WBC 8.5 11/08/2019   HGB 10.7 (L) 11/08/2019   HCT 33.1 (L) 11/08/2019   MCV 91.9 11/08/2019   PLT 194 11/08/2019    ASSESSMENT: 1) Labor curve reviewed.       Progress: Active phase labor.     Membranes: intact       Active Problems:   Labor and delivery, indication for care   PLAN: continue present management   Philip Aspen, CNM  11/08/2019 6:58 PM

## 2019-11-09 ENCOUNTER — Encounter: Payer: Self-pay | Admitting: Obstetrics and Gynecology

## 2019-11-09 LAB — RAPID HIV SCREEN (HIV 1/2 AB+AG)
HIV 1/2 Antibodies: NONREACTIVE
HIV-1 P24 Antigen - HIV24: NONREACTIVE

## 2019-11-09 LAB — CBC
HCT: 32.1 % — ABNORMAL LOW (ref 36.0–46.0)
Hemoglobin: 10.5 g/dL — ABNORMAL LOW (ref 12.0–15.0)
MCH: 29.7 pg (ref 26.0–34.0)
MCHC: 32.7 g/dL (ref 30.0–36.0)
MCV: 90.9 fL (ref 80.0–100.0)
Platelets: 186 10*3/uL (ref 150–400)
RBC: 3.53 MIL/uL — ABNORMAL LOW (ref 3.87–5.11)
RDW: 13.3 % (ref 11.5–15.5)
WBC: 14.4 10*3/uL — ABNORMAL HIGH (ref 4.0–10.5)
nRBC: 0 % (ref 0.0–0.2)

## 2019-11-09 LAB — RPR: RPR Ser Ql: NONREACTIVE

## 2019-11-09 MED ORDER — IBUPROFEN 600 MG PO TABS: 600.0000 mg | ORAL_TABLET | Freq: Four times a day (QID) | ORAL | Status: DC

## 2019-11-09 MED ORDER — SENNOSIDES-DOCUSATE SODIUM 8.6-50 MG PO TABS
2.0000 | ORAL_TABLET | ORAL | Status: DC
  Administered 2019-11-09 – 2019-11-10 (×2): 2 via ORAL
  Filled 2019-11-09 (×2): qty 2

## 2019-11-09 MED ORDER — ONDANSETRON HCL 4 MG PO TABS: 4.0000 mg | ORAL_TABLET | ORAL | Status: DC | PRN

## 2019-11-09 MED ORDER — BENZOCAINE-MENTHOL 20-0.5 % EX AERO
1.0000 "application " | INHALATION_SPRAY | CUTANEOUS | Status: DC | PRN
  Administered 2019-11-09 – 2019-11-10 (×2): 1 via TOPICAL
  Filled 2019-11-09 (×2): qty 56

## 2019-11-09 MED ORDER — WITCH HAZEL-GLYCERIN EX PADS
1.0000 "application " | MEDICATED_PAD | CUTANEOUS | Status: DC | PRN
  Administered 2019-11-09 – 2019-11-10 (×2): 1 via TOPICAL
  Filled 2019-11-09 (×2): qty 100

## 2019-11-09 MED ORDER — METHYLERGONOVINE MALEATE 0.2 MG/ML IJ SOLN: 0.2000 mg | INTRAMUSCULAR | Status: DC | PRN

## 2019-11-09 MED ORDER — ACETAMINOPHEN 325 MG PO TABS: 650.0000 mg | ORAL_TABLET | ORAL | Status: DC | PRN

## 2019-11-09 MED ORDER — PRENATAL MULTIVITAMIN CH
1.0000 | ORAL_TABLET | Freq: Every day | ORAL | Status: DC
  Administered 2019-11-09 – 2019-11-10 (×2): 1 via ORAL
  Filled 2019-11-09 (×2): qty 1

## 2019-11-09 MED ORDER — SENNOSIDES-DOCUSATE SODIUM 8.6-50 MG PO TABS: 2.0000 | ORAL_TABLET | ORAL | Status: DC

## 2019-11-09 MED ORDER — SIMETHICONE 80 MG PO CHEW: 80.0000 mg | CHEWABLE_TABLET | ORAL | Status: DC | PRN

## 2019-11-09 MED ORDER — IBUPROFEN 600 MG PO TABS
600.0000 mg | ORAL_TABLET | Freq: Four times a day (QID) | ORAL | Status: DC
  Administered 2019-11-09 – 2019-11-10 (×6): 600 mg via ORAL
  Filled 2019-11-09 (×6): qty 1

## 2019-11-09 MED ORDER — OXYCODONE-ACETAMINOPHEN 5-325 MG PO TABS: 1.0000 | ORAL_TABLET | ORAL | Status: DC | PRN

## 2019-11-09 MED ORDER — FERROUS SULFATE 325 (65 FE) MG PO TABS
325.0000 mg | ORAL_TABLET | Freq: Every day | ORAL | Status: DC
  Administered 2019-11-09 – 2019-11-10 (×2): 325 mg via ORAL
  Filled 2019-11-09 (×2): qty 1

## 2019-11-09 MED ORDER — OXYCODONE-ACETAMINOPHEN 5-325 MG PO TABS: 2.0000 | ORAL_TABLET | ORAL | Status: DC | PRN

## 2019-11-09 MED ORDER — ONDANSETRON HCL 4 MG/2ML IJ SOLN: 4.0000 mg | INTRAMUSCULAR | Status: DC | PRN

## 2019-11-09 MED ORDER — METHYLERGONOVINE MALEATE 0.2 MG PO TABS: 0.2000 mg | ORAL_TABLET | ORAL | Status: DC | PRN

## 2019-11-09 MED ORDER — TETANUS-DIPHTH-ACELL PERTUSSIS 5-2.5-18.5 LF-MCG/0.5 IM SUSP: 0.5000 mL | Freq: Once | INTRAMUSCULAR | Status: DC

## 2019-11-09 MED ORDER — DOCUSATE SODIUM 100 MG PO CAPS: 100.0000 mg | ORAL_CAPSULE | Freq: Two times a day (BID) | ORAL | Status: DC

## 2019-11-09 MED ORDER — DIBUCAINE (PERIANAL) 1 % EX OINT: 1.0000 "application " | TOPICAL_OINTMENT | CUTANEOUS | Status: DC | PRN

## 2019-11-09 MED ORDER — DOCUSATE SODIUM 100 MG PO CAPS
100.0000 mg | ORAL_CAPSULE | Freq: Two times a day (BID) | ORAL | Status: DC
  Administered 2019-11-09 (×2): 100 mg via ORAL
  Filled 2019-11-09 (×3): qty 1

## 2019-11-09 MED ORDER — COCONUT OIL OIL
1.0000 "application " | TOPICAL_OIL | Status: DC | PRN
  Administered 2019-11-10: 1 via TOPICAL
  Filled 2019-11-09 (×2): qty 120

## 2019-11-09 NOTE — Anesthesia Postprocedure Evaluation (Signed)
Anesthesia Post Note  Patient: Wendy Castro  Procedure(s) Performed: AN AD HOC LABOR EPIDURAL  Anesthesia Type: Epidural     Last Vitals:  Vitals:   11/09/19 0308 11/09/19 0310  BP: 110/66 109/68  Pulse: 91 89  Resp: 20 20  Temp: 37.1 C 37.1 C  SpO2: 97% 98%    Last Pain:  Vitals:   11/09/19 0310  TempSrc: Oral  PainSc:                  [name]

## 2019-11-09 NOTE — Anesthesia Postprocedure Evaluation (Signed)
Anesthesia Post Note  Patient: Wendy Castro  Procedure(s) Performed: AN AD HOC LABOR EPIDURAL  Patient location during evaluation: Mother Baby Anesthesia Type: Epidural Level of consciousness: awake and alert Pain management: pain level controlled Vital Signs Assessment: post-procedure vital signs reviewed and stable Respiratory status: spontaneous breathing, nonlabored ventilation and respiratory function stable Cardiovascular status: stable Postop Assessment: no headache, no backache and epidural receding Anesthetic complications: no     Last Vitals:  Vitals:   11/09/19 0308 11/09/19 0310  BP: 110/66 109/68  Pulse: 91 89  Resp: 20 20  Temp: 37.1 C 37.1 C  SpO2: 97% 98%    Last Pain:  Vitals:   11/09/19 0310  TempSrc: Oral  PainSc:                  [name]

## 2019-11-09 NOTE — Progress Notes (Signed)
Progress Note - Vaginal Delivery  Wendy Castro is a 23 y.o. G2P2002 now PP day 1 s/p Vaginal, Spontaneous .   Subjective:  The patient reports no complaints, up ad lib, voiding and tolerating PO   Objective:  Vital signs in last 24 hours: Temp:  [97.9 F (36.6 C)-99.3 F (37.4 C)] 98.8 F (37.1 C) (12/31 0310) Pulse Rate:  [75-129] 89 (12/31 0310) Resp:  [18-20] 20 (12/31 0310) BP: (98-141)/(63-97) 109/68 (12/31 0310) SpO2:  [97 %-100 %] 98 % (12/31 0310) Weight:  [72.7 kg] 72.7 kg (12/30 1258)  Physical Exam:  General: alert, cooperative, appears stated age and fatigued Lochia: appropriate Uterine Fundus: firm @ u DVT Evaluation: No evidence of DVT seen on physical exam. No cords or calf tenderness. No significant calf/ankle edema.    Data Review Recent Labs    11/08/19 1720 11/09/19 0525  HGB 10.7* 10.5*  HCT 33.1* 32.1*    Assessment/Plan: Active Problems:   Labor and delivery, indication for care   Plan for discharge tomorrow   -- Continue routine PP care.     Philip Aspen, CNM 11/09/2019 7:40 AM

## 2019-11-10 MED ORDER — IBUPROFEN 600 MG PO TABS: 600.0000 mg | ORAL_TABLET | Freq: Four times a day (QID) | ORAL | 0 refills | Status: DC

## 2019-11-10 NOTE — Progress Notes (Signed)
Discharge order received from doctor. Reviewed discharge instructions and prescriptions with patient and answered all questions. Follow up appointment instructions given. Patient verbalized understanding. ID bands checked. Patient discharged home with infant via wheelchair by nursing/auxillary.    Skarlett Sedlacek Garner, RN  

## 2019-11-10 NOTE — Discharge Instructions (Signed)

## 2019-11-10 NOTE — Final Progress Note (Signed)
Discharge Day SOAP Note:  Progress Note - Vaginal Delivery  Wendy Castro is a 24 y.o. G2P2002 now PP day 2 s/p Vaginal, Spontaneous . Delivery was uncomplicated  Subjective  The patient has the following complaints: has no unusual complaints  Pain is controlled with current medications.   Patient is urinating without difficulty.  She is ambulating well.    Objective  Vital signs: BP (!) 90/57 (BP Location: Right Arm)   Pulse 66   Temp 98.4 F (36.9 C) (Oral)   Resp 18   Ht 5\' 4"  (1.626 m)   Wt 72.7 kg   LMP 02/14/2019 (Exact Date)   SpO2 99%   Breastfeeding Unknown   BMI 27.53 kg/m   Physical Exam: Gen: NAD Fundus Fundal Tone: Firm  Lochia Amount: Small  Perineum Appearance: Intact, Edematous     Data Review Labs: CBC Latest Ref Rng & Units 11/09/2019 11/08/2019 09/15/2019  WBC 4.0 - 10.5 K/uL 14.4(H) 8.5 7.4  Hemoglobin 12.0 - 15.0 g/dL 10.5(L) 10.7(L) 10.8(L)  Hematocrit 36.0 - 46.0 % 32.1(L) 33.1(L) 31.9(L)  Platelets 150 - 400 K/uL 186 194 192   B POS  Assessment/Plan  Active Problems:   Labor and delivery, indication for care    Plan for discharge today.   Discharge Instructions: Per After Visit Summary. Activity: Advance as tolerated. Pelvic rest for 6 weeks.  Also refer to After Visit Summary Diet: Regular Medications: Allergies as of 11/10/2019      Reactions   Amoxicillin Rash      Medication List    TAKE these medications   ferrous sulfate 325 (65 FE) MG EC tablet Take 325 mg by mouth 3 (three) times daily with meals.   ibuprofen 600 MG tablet Commonly known as: ADVIL Take 1 tablet (600 mg total) by mouth every 6 (six) hours.   multivitamin-prenatal 27-0.8 MG Tabs tablet Take 1 tablet by mouth daily at 12 noon.      Outpatient follow up:  Follow-up Information    01/08/2020, CNM. Schedule an appointment as soon as possible for a visit in 6 week(s).   Specialties: Certified Nurse Midwife, Radiology Why: Please call to  schedule a 6 week postpartum follow up appointment with Doreene Burke Contact information: [address] [place] [phone]          Postpartum contraception: pt spouse had vasectomy, will have follow up appointment in Feb. Plans condoms until then.   Discharged Condition: good  Discharged to: home  Newborn Data: Disposition:home with mother  Apgars: APGAR (1 MIN): 9   APGAR (5 MINS): 10   APGAR (10 MINS):    Baby Feeding: Breast    Mar, CNM  11/10/2019 9:44 AM

## 2019-11-10 NOTE — Discharge Summary (Signed)
                              Discharge Summary  Date of Admission: 11/08/2019  Date of Discharge: 11/10/2019  Admitting Diagnosis: Onset of Labor at [redacted]w[redacted]d  Mode of Delivery: normal spontaneous vaginal delivery                 Discharge Diagnosis: No other diagnosis   Intrapartum Procedures: epidural   Post partum procedures: none  Complications: none                      Discharge Day SOAP Note:  Progress Note - Vaginal Delivery  Wendy Castro is a 24 y.o. G2P2002 now PP day 2 s/p Vaginal, Spontaneous . Delivery was uncomplicated  Subjective  The patient has the following complaints: has no unusual complaints  Pain is controlled with current medications.   Patient is urinating without difficulty.  She is ambulating well.    Objective  Vital signs: BP (!) 90/57 (BP Location: Right Arm)   Pulse 66   Temp 98.4 F (36.9 C) (Oral)   Resp 18   Ht 5\' 4"  (1.626 m)   Wt 72.7 kg   LMP 02/14/2019 (Exact Date)   SpO2 99%   Breastfeeding Unknown   BMI 27.53 kg/m   Physical Exam: Gen: NAD Fundus Fundal Tone: Firm  Lochia Amount: Small  Perineum Appearance: Intact, Edematous     Data Review Labs: CBC Latest Ref Rng & Units 11/09/2019 11/08/2019 09/15/2019  WBC 4.0 - 10.5 K/uL 14.4(H) 8.5 7.4  Hemoglobin 12.0 - 15.0 g/dL 10.5(L) 10.7(L) 10.8(L)  Hematocrit 36.0 - 46.0 % 32.1(L) 33.1(L) 31.9(L)  Platelets 150 - 400 K/uL 186 194 192   B POS  Assessment/Plan  Active Problems:   Labor and delivery, indication for care    Plan for discharge today.   Discharge Instructions: Per After Visit Summary. Activity: Advance as tolerated. Pelvic rest for 6 weeks.  Also refer to After Visit Summary Diet: Regular Medications: Allergies as of 11/10/2019      Reactions   Amoxicillin Rash      Medication List    TAKE these medications   ferrous sulfate 325 (65 FE) MG EC tablet Take 325 mg by mouth 3 (three) times daily with meals.   ibuprofen 600 MG tablet Commonly  known as: ADVIL Take 1 tablet (600 mg total) by mouth every 6 (six) hours.   multivitamin-prenatal 27-0.8 MG Tabs tablet Take 1 tablet by mouth daily at 12 noon.      Outpatient follow up:  Follow-up Information    01/08/2020, CNM. Schedule an appointment as soon as possible for a visit in 6 week(s).   Specialties: Certified Nurse Midwife, Radiology Why: Please call to schedule a 6 week postpartum follow up appointment with Doreene Burke Contact information: [address] [place] [phone]          Postpartum contraception: pt spouse had vasectomy, will have follow up appointment in Feb. Plans condoms until then.   Discharged Condition: good  Discharged to: home  Newborn Data: Disposition:home with mother  Apgars: APGAR (1 MIN): 9   APGAR (5 MINS): 10   APGAR (10 MINS):    Baby Feeding: Breast    Mar, CNM  11/10/2019 9:44 AM

## 2019-11-15 ENCOUNTER — Encounter: Payer: 59 | Admitting: Certified Nurse Midwife

## 2019-11-19 ENCOUNTER — Inpatient Hospital Stay: Admit: 2019-11-19 | Payer: Self-pay

## 2019-11-27 ENCOUNTER — Other Ambulatory Visit: Payer: Self-pay | Admitting: Certified Nurse Midwife

## 2019-11-27 MED ORDER — SERTRALINE HCL 50 MG PO TABS: 50.0000 mg | ORAL_TABLET | Freq: Every day | ORAL | 0 refills | Status: DC

## 2019-11-27 NOTE — Progress Notes (Signed)
Pt reached out via my chart with c/o depression. PHQ9 19. Discussed use of medications and counseling. Orders placed for counseling through health department Kathreen Cosier). Pt state she does not have the money to come in for a visit. She c/o feeling like she is not bonding with the baby. Zoloft 50 mg daily ordered. She will follow up in one month.   Doreene Burke, CNM

## 2019-11-28 ENCOUNTER — Other Ambulatory Visit: Payer: Self-pay | Admitting: Certified Nurse Midwife

## 2019-11-29 ENCOUNTER — Other Ambulatory Visit: Payer: Self-pay | Admitting: Certified Nurse Midwife

## 2019-11-29 DIAGNOSIS — F53 Postpartum depression: Secondary | ICD-10-CM

## 2019-11-29 NOTE — Progress Notes (Signed)
Referral placed for counseling with Kathreen Cosier, [facility].   Doreene Burke, CNM

## 2019-11-30 ENCOUNTER — Telehealth: Payer: Self-pay | Admitting: Licensed Clinical Social Worker

## 2019-11-30 NOTE — Telephone Encounter (Signed)
-----   Message from [facility] sent at 11/29/2019  4:01 PM EST ----- Regarding: RE: referral That would be fine -- thanks!! ----- Message ----- From: Kathreen Cosier, [place] Sent: 11/29/2019   3:10 PM EST To: Doreatha Lew Subject: referral                                       Hi Rose,  I still have not received a referral for this patient, should I use your phone call as my referral to follow up with this patient?   Thanks, AM

## 2019-12-24 ENCOUNTER — Other Ambulatory Visit: Payer: Self-pay | Admitting: Certified Nurse Midwife

## 2019-12-26 ENCOUNTER — Ambulatory Visit (INDEPENDENT_AMBULATORY_CARE_PROVIDER_SITE_OTHER): Payer: 59 | Admitting: Certified Nurse Midwife

## 2019-12-26 ENCOUNTER — Encounter: Payer: Self-pay | Admitting: Certified Nurse Midwife

## 2019-12-26 ENCOUNTER — Other Ambulatory Visit: Payer: Self-pay

## 2019-12-26 NOTE — Patient Instructions (Signed)
Preventive Care 21-24 Years Old, Female Preventive care refers to visits with your health care provider and lifestyle choices that can promote health and wellness. This includes:  A yearly physical exam. This may also be called an annual well check.  Regular dental visits and eye exams.  Immunizations.  Screening for certain conditions.  Healthy lifestyle choices, such as eating a healthy diet, getting regular exercise, not using drugs or products that contain nicotine and tobacco, and limiting alcohol use. What can I expect for my preventive care visit? Physical exam Your health care provider will check your:  Height and weight. This may be used to calculate body mass index (BMI), which tells if you are at a healthy weight.  Heart rate and blood pressure.  Skin for abnormal spots. Counseling Your health care provider may ask you questions about your:  Alcohol, tobacco, and drug use.  Emotional well-being.  Home and relationship well-being.  Sexual activity.  Eating habits.  Work and work environment.  Method of birth control.  Menstrual cycle.  Pregnancy history. What immunizations do I need?  Influenza (flu) vaccine  This is recommended every year. Tetanus, diphtheria, and pertussis (Tdap) vaccine  You may need a Td booster every 10 years. Varicella (chickenpox) vaccine  You may need this if you have not been vaccinated. Human papillomavirus (HPV) vaccine  If recommended by your health care provider, you may need three doses over 6 months. Measles, mumps, and rubella (MMR) vaccine  You may need at least one dose of MMR. You may also need a second dose. Meningococcal conjugate (MenACWY) vaccine  One dose is recommended if you are age 19-21 years and a first-year college student living in a residence hall, or if you have one of several medical conditions. You may also need additional booster doses. Pneumococcal conjugate (PCV13) vaccine  You may need  this if you have certain conditions and were not previously vaccinated. Pneumococcal polysaccharide (PPSV23) vaccine  You may need one or two doses if you smoke cigarettes or if you have certain conditions. Hepatitis A vaccine  You may need this if you have certain conditions or if you travel or work in places where you may be exposed to hepatitis A. Hepatitis B vaccine  You may need this if you have certain conditions or if you travel or work in places where you may be exposed to hepatitis B. Haemophilus influenzae type b (Hib) vaccine  You may need this if you have certain conditions. You may receive vaccines as individual doses or as more than one vaccine together in one shot (combination vaccines). Talk with your health care provider about the risks and benefits of combination vaccines. What tests do I need?  Blood tests  Lipid and cholesterol levels. These may be checked every 5 years starting at age 20.  Hepatitis C test.  Hepatitis B test. Screening  Diabetes screening. This is done by checking your blood sugar (glucose) after you have not eaten for a while (fasting).  Sexually transmitted disease (STD) testing.  BRCA-related cancer screening. This may be done if you have a family history of breast, ovarian, tubal, or peritoneal cancers.  Pelvic exam and Pap test. This may be done every 3 years starting at age 21. Starting at age 30, this may be done every 5 years if you have a Pap test in combination with an HPV test. Talk with your health care provider about your test results, treatment options, and if necessary, the need for more tests.   Follow these instructions at home: Eating and drinking   Eat a diet that includes fresh fruits and vegetables, whole grains, lean protein, and low-fat dairy.  Take vitamin and mineral supplements as recommended by your health care provider.  Do not drink alcohol if: ? Your health care provider tells you not to drink. ? You are  pregnant, may be pregnant, or are planning to become pregnant.  If you drink alcohol: ? Limit how much you have to 0-1 drink a day. ? Be aware of how much alcohol is in your drink. In the [place] one drink equals one 12 oz bottle of beer (355 mL), one 5 oz glass of wine (148 mL), or one 1 oz glass of hard liquor (44 mL). Lifestyle  Take daily care of your teeth and gums.  Stay active. Exercise for at least 30 minutes on 5 or more days each week.  Do not use any products that contain nicotine or tobacco, such as cigarettes, e-cigarettes, and chewing tobacco. If you need help quitting, ask your health care provider.  If you are sexually active, practice safe sex. Use a condom or other form of birth control (contraception) in order to prevent pregnancy and STIs (sexually transmitted infections). If you plan to become pregnant, see your health care provider for a preconception visit. What's next?  Visit your health care provider once a year for a well check visit.  Ask your health care provider how often you should have your eyes and teeth checked.  Stay up to date on all vaccines. This information is not intended to replace advice given to you by your health care provider. Make sure you discuss any questions you have with your health care provider. Document Revised: 07/07/2018 Document Reviewed: 07/07/2018 Elsevier Patient Education  2020 [employer].

## 2019-12-26 NOTE — Progress Notes (Signed)
Subjective:    Wendy Castro is a 24 y.o. [id number] Caucasian female who presents for a postpartum visit. She is 6 weeks postpartum following a spontaneous vaginal delivery at 38.3 gestational weeks. Anesthesia: epidural. I have fully reviewed the prenatal and intrapartum course. Postpartum course has been WNL. Baby's course has been WNL. Baby is feeding by breast. Bleeding no bleeding. Bowel function is normal. Bladder function is normal. Patient is sexually active. Contraception method is vasectomy. Postpartum depression screening: positive. Score 6.  Last pap 2019 and was WNL.  The following portions of the patient's history were reviewed and updated as appropriate: allergies, current medications, past medical history, past surgical history and problem list.  Review of Systems Pertinent items are noted in HPI.   Vitals:   12/26/19 1342  BP: 101/78  Pulse: (!) 101  Weight: 139 lb (63 kg)  Height: 5\' 4"  (1.626 m)   No LMP recorded.  Objective:   General:  alert, cooperative and no distress   Breasts:  deferred, no complaints  Lungs: clear to auscultation bilaterally  Heart:  regular rate and rhythm  Abdomen: soft, nontender   Vulva: normal  Vagina: normal vagina  Cervix:  closed  Corpus: Well-involuted  Adnexa:  Non-palpable  Rectal Exam: no hemorrhoids        Assessment:   Postpartum exam 6 wks s/p SVD Breastfeeding Depression screening Contraception counseling   Plan:  : vasectomy Follow up in: 8 months for annual  or earlier if needed  , CNM , CNM

## 2020-04-07 ENCOUNTER — Telehealth: Payer: 59 | Admitting: Nurse Practitioner

## 2020-04-07 DIAGNOSIS — R399 Unspecified symptoms and signs involving the genitourinary system: Secondary | ICD-10-CM | POA: Diagnosis not present

## 2020-04-07 MED ORDER — NITROFURANTOIN MONOHYD MACRO 100 MG PO CAPS: 100.0000 mg | ORAL_CAPSULE | Freq: Two times a day (BID) | ORAL | 0 refills | Status: AC

## 2020-04-07 NOTE — Progress Notes (Signed)
We are sorry that you are not feeling well.  Here is how we plan to help!  Based on what you shared with me it looks like you most likely have a simple urinary tract infection.  A UTI (Urinary Tract Infection) is a bacterial infection of the bladder.  Most cases of urinary tract infections are simple to treat but a key part of your care is to encourage you to drink plenty of fluids and watch your symptoms carefully.  * if you are no better by Tuesday , you will need to see your PCP.  I have prescribed MacroBid 100 mg twice a day for 5 days.  Your symptoms should gradually improve. Call us if the burning in your urine worsens, you develop worsening fever, back pain or pelvic pain or if your symptoms do not resolve after completing the antibiotic.  Urinary tract infections can be prevented by drinking plenty of water to keep your body hydrated.  Also be sure when you wipe, wipe from front to back and don't hold it in!  If possible, empty your bladder every 4 hours.  Your e-visit answers were reviewed by a board certified advanced clinical practitioner to complete your personal care plan.  Depending on the condition, your plan could have included both over the counter or prescription medications.  If there is a problem please reply  once you have received a response from your provider.  Your safety is important to [place].  If you have drug allergies check your prescription carefully.    You can use MyChart to ask questions about today's visit, request a non-urgent call back, or ask for a work or school excuse for 24 hours related to this e-Visit. If it has been greater than 24 hours you will need to follow up with your provider, or enter a new e-Visit to address those concerns.   You will get an e-mail in the next two days asking about your experience.  I hope that your e-visit has been valuable and will speed your recovery. Thank you for using e-visits.   5-10 minutes spent reviewing and  documenting in chart.

## 2020-07-22 ENCOUNTER — Encounter: Payer: 59 | Admitting: Certified Nurse Midwife

## 2020-07-23 ENCOUNTER — Encounter: Payer: 59 | Admitting: Certified Nurse Midwife

## 2020-07-31 ENCOUNTER — Encounter: Payer: Self-pay | Admitting: Certified Nurse Midwife

## 2022-01-26 ENCOUNTER — Ambulatory Visit: Payer: 59

## 2022-03-24 ENCOUNTER — Emergency Department: Payer: 59

## 2022-03-24 ENCOUNTER — Encounter: Payer: Self-pay | Admitting: Emergency Medicine

## 2022-03-24 ENCOUNTER — Emergency Department
Admission: EM | Admit: 2022-03-24 | Discharge: 2022-03-24 | Disposition: A | Discharge status: Home / Self Care | Payer: 59 | Attending: Emergency Medicine | Admitting: Emergency Medicine

## 2022-03-24 ENCOUNTER — Other Ambulatory Visit: Payer: Self-pay

## 2022-03-24 DIAGNOSIS — R791 Abnormal coagulation profile: Secondary | ICD-10-CM | POA: Insufficient documentation

## 2022-03-24 DIAGNOSIS — R0789 Other chest pain: Secondary | ICD-10-CM | POA: Insufficient documentation

## 2022-03-24 LAB — BASIC METABOLIC PANEL
Anion gap: 8 (ref 5–15)
BUN: 16 mg/dL (ref 6–20)
CO2: 26 mmol/L (ref 22–32)
Calcium: 9.1 mg/dL (ref 8.9–10.3)
Chloride: 106 mmol/L (ref 98–111)
Creatinine, Ser: 0.91 mg/dL (ref 0.44–1.00)
GFR, Estimated: >60 mL/min (ref >60)
Glucose, Bld: 88 mg/dL (ref 70–99)
Potassium: 3.4 mmol/L — ABNORMAL LOW (ref 3.5–5.1)
Sodium: 140 mmol/L (ref 135–145)

## 2022-03-24 LAB — CBC
HCT: 35.9 % — ABNORMAL LOW (ref 36.0–46.0)
Hemoglobin: 11.6 g/dL — ABNORMAL LOW (ref 12.0–15.0)
MCH: 29.6 pg (ref 26.0–34.0)
MCHC: 32.3 g/dL (ref 30.0–36.0)
MCV: 91.6 fL (ref 80.0–100.0)
Platelets: 213 10*3/uL (ref 150–400)
RBC: 3.92 MIL/uL (ref 3.87–5.11)
RDW: 13.2 % (ref 11.5–15.5)
WBC: 6.1 10*3/uL (ref 4.0–10.5)
nRBC: 0 % (ref 0.0–0.2)

## 2022-03-24 LAB — D-DIMER, QUANTITATIVE: D-Dimer, Quant: 0.84 ug/mL-FEU — ABNORMAL HIGH (ref 0.00–0.50)

## 2022-03-24 LAB — TROPONIN I (HIGH SENSITIVITY)
Troponin I (High Sensitivity): <2 ng/L (ref <18)
Troponin I (High Sensitivity): <2 ng/L (ref <18)

## 2022-03-24 LAB — POC URINE PREG, ED: Preg Test, Ur: NEGATIVE

## 2022-03-24 MED ORDER — IOHEXOL 350 MG/ML SOLN
75.0000 mL | Freq: Once | INTRAVENOUS | Status: AC | PRN
  Administered 2022-03-24: 75 mL via INTRAVENOUS

## 2022-03-24 MED ORDER — LIDOCAINE 5 % EX PTCH
1.0000 | MEDICATED_PATCH | Freq: Once | CUTANEOUS | Status: DC
  Administered 2022-03-24: 1 via TRANSDERMAL
  Filled 2022-03-24: qty 1

## 2022-03-24 MED ORDER — KETOROLAC TROMETHAMINE 30 MG/ML IJ SOLN
15.0000 mg | Freq: Once | INTRAMUSCULAR | Status: AC
  Administered 2022-03-24: 15 mg via INTRAVENOUS
  Filled 2022-03-24: qty 1

## 2022-03-24 MED ORDER — LIDOCAINE 5 % EX PTCH: 1.0000 | MEDICATED_PATCH | Freq: Two times a day (BID) | CUTANEOUS | 0 refills | Status: AC

## 2022-03-24 NOTE — ED Notes (Signed)
Provider Jessup at bedside.  

## 2022-03-24 NOTE — ED Notes (Addendum)
See triage note. Pt reports CP is now all across chest; describes it as a "pressure or tightness"; pain in back; denies jaw pain or arm pain/numbness; denies nausea; history of anxiety which pt states is why she did not come in initially; resp reg/unlabored; skin dry; calmly laying on stretcher. ?

## 2022-03-24 NOTE — ED Provider Notes (Signed)
? ?[facility] ?Provider Note ? ? ? Event Date/Time  ? First MD Initiated Contact with Patient 03/24/22 1531   ?  (approximate) ? ? ?History  ? ?Chief Complaint ?Chest Pain ? ? ?HPI ? ?Wendy Castro is a 26 y.o. female with past medical history of anxiety who presents to the ED complaining of chest pain.  Patient reports that she has been dealing with intermittent pain in the center of her chest for about the past 5 days.  Pain has become constant over the past 12 hours and is described as a sharp and stabbing pain that is worse when she takes a deep breath.  She denies any associated fevers or cough, does state that she has been feeling slightly short of breath.  She denies any pain or swelling in her legs, has not had any recent long trips, and denies any history of DVT/PE.  She does not take any OCPs, does states she has Nexplanon in place.  She additionally reports occasional heavy lifting as part of her job at [employer]. ?  ? ? ?Physical Exam  ? ?Triage Vital Signs: ?ED Triage Vitals  ?Enc Vitals Group  ?   BP 03/24/22 1219 97/71  ?   Pulse Rate 03/24/22 1219 83  ?   Resp 03/24/22 1219 18  ?   Temp 03/24/22 1219 98.3 ?F (36.8 ?C)  ?   Temp Source 03/24/22 1219 Oral  ?   SpO2 03/24/22 1219 99 %  ?   Weight 03/24/22 1216 135 lb (61.2 kg)  ?   Height 03/24/22 1216 5\' 4"  (1.626 m)  ?   Head Circumference --   ?   Peak Flow --   ?   Pain Score 03/24/22 1216 8  ?   Pain Loc --   ?   Pain Edu? --   ?   Excl. in GC? --   ? ? ?Most recent vital signs: ?Vitals:  ? 03/24/22 1631 03/24/22 1744  ?BP: 95/71 100/68  ?Pulse: 64 60  ?Resp: 16 16  ?Temp:    ?SpO2: 100% 100%  ? ? ?Constitutional: Alert and oriented. ?Eyes: Conjunctivae are normal. ?Head: Atraumatic. ?Nose: No congestion/rhinnorhea. ?Mouth/Throat: Mucous membranes are moist.  ?Cardiovascular: Normal rate, regular rhythm. Grossly normal heart sounds.  2+ radial pulses bilaterally. ?Respiratory: Normal respiratory effort.  No retractions.  Lungs CTAB.  Bilateral chest wall tenderness to palpation noted. ?Gastrointestinal: Soft and nontender. No distention. ?Musculoskeletal: No lower extremity tenderness nor edema.  ?Neurologic:  Normal speech and language. No gross focal neurologic deficits are appreciated. ? ? ? ?ED Results / Procedures / Treatments  ? ?Labs ?(all labs ordered are listed, but only abnormal results are displayed) ?Labs Reviewed  ?BASIC METABOLIC PANEL - Abnormal; Notable for the following components:  ?    Result Value  ? Potassium 3.4 (*)   ? All other components within normal limits  ?CBC - Abnormal; Notable for the following components:  ? Hemoglobin 11.6 (*)   ? HCT 35.9 (*)   ? All other components within normal limits  ?D-DIMER, QUANTITATIVE - Abnormal; Notable for the following components:  ? D-Dimer, Quant 0.84 (*)   ? All other components within normal limits  ?POC URINE PREG, ED  ?TROPONIN I (HIGH SENSITIVITY)  ?TROPONIN I (HIGH SENSITIVITY)  ? ? ? ?EKG ? ?ED ECG REPORT ?03/26/22, the attending physician, personally viewed and interpreted this ECG. ? ? Date: 03/24/2022 ? EKG Time: 12:16 ? Rate: 87 ?  Rhythm: normal sinus rhythm ? Axis: RAD ? Intervals:none ? ST&T Change: None ? ?RADIOLOGY ?CXR reviewed and interpreted by me with infiltrate, edema, or effusion. ? ?PROCEDURES: ? ?Critical Care performed: No ? ?Procedures ? ? ?MEDICATIONS ORDERED IN ED: ?Medications  ?lidocaine (LIDODERM) 5 % 1 patch (1 patch Transdermal Patch Applied 03/24/22 1629)  ?ketorolac (TORADOL) 30 MG/ML injection 15 mg (15 mg Intravenous Given 03/24/22 1634)  ?iohexol (OMNIPAQUE) 350 MG/ML injection 75 mL (75 mLs Intravenous Contrast Given 03/24/22 1759)  ? ? ? ?IMPRESSION / MDM / ASSESSMENT AND PLAN / ED COURSE  ?I reviewed the triage vital signs and the nursing notes. ?             ?               ? ?26 y.o. female with past medical history of anxiety presents to the ED complaining of intermittent stabbing pain in her chest for the past 5 days  that has become constant with some difficulty breathing over the past 12 hours. ? ?Differential diagnosis includes, but is not limited to, ACS, PE, dissection, pneumonia, pneumothorax, anxiety, GERD, musculoskeletal pain. ? ?Patient well-appearing and in no acute distress, vital signs are unremarkable and EKG shows no evidence of arrhythmia or ischemia.  Symptoms seem most likely to be musculoskeletal in etiology given they are reproducible with palpation of her chest.  Low suspicion for ACS given atypical symptoms with no risk factors, troponin is negative and repeat not indicated given constant pain for the past 12 hours.  Chest x-ray is unremarkable and remainder of labs are also reassuring, CBC with chronic anemia and no leukocytosis, BMP without electrolyte abnormality or AKI.  We will check D-dimer as we are unable to rule out PE via PERC criteria, patient remains low risk by Wells criteria.  Plan to treat symptomatically with IV Toradol and Lidoderm patch. ? ?D-dimer noted to be elevated, however CTA performed and negative for PE or other acute process.  Patient reports that the pain in her chest is improved following dose of Toradol and Lidoderm patch.  She is appropriate for discharge home with outpatient follow-up for suspected musculoskeletal chest pain.  She was counseled to return to the ED for new worsening symptoms, otherwise counseled to follow-up with her PCP and to return to the ED for new worsening symptoms.  Patient agrees with plan. ? ?  ? ? ?FINAL CLINICAL IMPRESSION(S) / ED DIAGNOSES  ? ?Final diagnoses:  ?Atypical chest pain  ?Chest wall pain  ? ? ? ?Rx / DC Orders  ? ?ED Discharge Orders   ? ?      Ordered  ?  lidocaine (LIDODERM) 5 %  Every 12 hours       ? 03/24/22 1837  ? ?  ?  ? ?  ? ? ? ?Note:  This document was prepared using Dragon voice recognition software and may include unintentional dictation errors. ?  ?Chesley Noon, MD ?03/24/22 1838 ? ?

## 2022-03-24 NOTE — ED Provider Triage Note (Signed)
Emergency Medicine Provider Triage Evaluation Note ? ?Wendy Castro , a 26 y.o. female  was evaluated in triage.  Pt complains of chest pain, right-sided.  Got Nexplanon 2 months ago. ? ?Review of Systems  ?Positive: Chest pain ?Negative: Abdominal pain ? ?Physical Exam  ?BP 97/71 (BP Location: Left Arm)   Pulse 83   Temp 98.3 ?F (36.8 ?C) (Oral)   Resp 18   Ht 5\' 4"  (1.626 m)   Wt 61.2 kg   SpO2 99%   BMI 23.17 kg/m?  ?Gen:   Awake, no distress   ?Resp:  Normal effort  ?MSK:   Moves extremities without difficulty  ?Other:   ? ?Medical Decision Making  ?Medically screening exam initiated at 12:21 PM.  Appropriate orders placed.  Benedicta Sultan was informed that the remainder of the evaluation will be completed by another provider, this initial triage assessment does not replace that evaluation, and the importance of remaining in the ED until their evaluation is complete. ? ?Describes her chest pain as sharp and stabbing ?  ?Wendy Oven, PA-C ?03/24/22 1222 ? ?

## 2022-03-24 NOTE — ED Triage Notes (Signed)
Pt here with right sided CP that started a week ago. Pt states pain now radiates to her back. Pt denies N/V/D. ?

## 2022-03-24 NOTE — ED Notes (Signed)
20g IV placed by this RN; trop and d-dimer collected and sent to lab.  ?

## 2022-10-03 ENCOUNTER — Other Ambulatory Visit: Payer: Self-pay

## 2022-10-03 ENCOUNTER — Encounter: Payer: Self-pay | Admitting: Emergency Medicine

## 2022-10-03 ENCOUNTER — Emergency Department
Admission: EM | Admit: 2022-10-03 | Discharge: 2022-10-03 | Disposition: A | Discharge status: Home / Self Care | Payer: 59 | Attending: Emergency Medicine | Admitting: Emergency Medicine

## 2022-10-03 DIAGNOSIS — A6004 Herpesviral vulvovaginitis: Secondary | ICD-10-CM | POA: Insufficient documentation

## 2022-10-03 MED ORDER — VALACYCLOVIR HCL 1 G PO TABS: 2000.0000 mg | ORAL_TABLET | Freq: Two times a day (BID) | ORAL | 0 refills | Status: AC

## 2022-10-03 NOTE — ED Provider Notes (Signed)
[facility] Provider Note  Patient Contact: 4:34 PM (approximate)   History   Vaginal Itching   HPI  Wendy Castro is a 26 y.o. female presents to the emergency department with a burning, painful rash along labia and inner thighs.  Patient has had unprotected sex in the past 6 months and states that her ex-husband was unfaithful.  No dysuria or possibility of pregnancy.  No hematuria or flank pain.      Physical Exam   Triage Vital Signs: ED Triage Vitals  Enc Vitals Group     BP 10/03/22 1537 111/77     Pulse Rate 10/03/22 1537 79     Resp 10/03/22 1537 16     Temp 10/03/22 1537 98.3 F (36.8 C)     Temp Source 10/03/22 1537 Oral     SpO2 10/03/22 1537 100 %     Weight 10/03/22 1538 135 lb (61.2 kg)     Height 10/03/22 1538 5\' 4"  (1.626 m)     Head Circumference --      Peak Flow --      Pain Score 10/03/22 1536 3     Pain Loc --      Pain Edu? --      Excl. in GC? --     Most recent vital signs: Vitals:   10/03/22 1537  BP: 111/77  Pulse: 79  Resp: 16  Temp: 98.3 F (36.8 C)  SpO2: 100%     General: Alert and in no acute distress. Eyes:  PERRL. EOMI. Head: No acute traumatic findings ENT:      Nose: No congestion/rhinnorhea.      Mouth/Throat: Mucous membranes are moist. Neck: No stridor. No cervical spine tenderness to palpation. Hematological/Lymphatic/Immunilogical: No cervical lymphadenopathy. Cardiovascular:  Good peripheral perfusion Respiratory: Normal respiratory effort without tachypnea or retractions. Lungs CTAB. Good air entry to the bases with no decreased or absent breath sounds. Gastrointestinal: Bowel sounds 4 quadrants. Soft and nontender to palpation. No guarding or rigidity. No palpable masses. No distention. No CVA tenderness. Musculoskeletal: Full range of motion to all extremities.  Neurologic:  No gross focal neurologic deficits are appreciated.  Skin:  Patient has vesicular rash along inner thighs and  labia.    ED Results / Procedures / Treatments   Labs (all labs ordered are listed, but only abnormal results are displayed) Labs Reviewed - No data to display      PROCEDURES:  Critical Care performed: No  Procedures   MEDICATIONS ORDERED IN ED: Medications - No data to display   IMPRESSION / MDM / ASSESSMENT AND PLAN / ED COURSE  I reviewed the triage vital signs and the nursing notes.                              Assessment and plan:  Genital herpes 26 year old female presents to the emergency department with a burning rash along the labia majora and inner thighs.  Vital signs were reassuring at triage.  On exam, patient was alert and nontoxic-appearing.  Physical exam findings are consistent with genital herpes.  Patient was discharged with Valtrex.  Patient declined wet prep and gonorrhea and Chlamydia testing.   FINAL CLINICAL IMPRESSION(S) / ED DIAGNOSES   Final diagnoses:  Herpes simplex vulvovaginitis     Rx / DC Orders   ED Discharge Orders          Ordered    valACYclovir (VALTREX)  1000 MG tablet  2 times daily        10/03/22 1632             Note:  This document was prepared using Dragon voice recognition software and may include unintentional dictation errors.   Pia Mau [place], PA-C 10/03/22 1642    Sharman Cheek, MD 10/03/22 1921

## 2022-10-03 NOTE — Discharge Instructions (Signed)
Take Valtrex twice daily for ten days.

## 2022-10-03 NOTE — ED Triage Notes (Signed)
Patient arrives ambulatory by POV c/o vaginal irritation and chafing x 1 week. Patient denies any painful urination or discharge. Reports pain when walking and sitting.

## 2023-05-27 ENCOUNTER — Other Ambulatory Visit: Payer: Self-pay

## 2023-05-27 ENCOUNTER — Emergency Department
Admission: EM | Admit: 2023-05-27 | Discharge: 2023-05-27 | Disposition: A | Discharge status: Home / Self Care | Payer: Medicaid Other | Attending: Emergency Medicine | Admitting: Emergency Medicine

## 2023-05-27 ENCOUNTER — Emergency Department: Payer: Medicaid Other

## 2023-05-27 DIAGNOSIS — N8311 Corpus luteum cyst of right ovary: Secondary | ICD-10-CM | POA: Diagnosis not present

## 2023-05-27 DIAGNOSIS — R1031 Right lower quadrant pain: Secondary | ICD-10-CM | POA: Diagnosis present

## 2023-05-27 DIAGNOSIS — N83201 Unspecified ovarian cyst, right side: Secondary | ICD-10-CM

## 2023-05-27 LAB — COMPREHENSIVE METABOLIC PANEL
ALT: 21 U/L (ref 0–44)
AST: 18 U/L (ref 15–41)
Albumin: 4.3 g/dL (ref 3.5–5.0)
Alkaline Phosphatase: 62 U/L (ref 38–126)
Anion gap: 9 (ref 5–15)
BUN: 15 mg/dL (ref 6–20)
CO2: 24 mmol/L (ref 22–32)
Calcium: 9.1 mg/dL (ref 8.9–10.3)
Chloride: 103 mmol/L (ref 98–111)
Creatinine, Ser: 0.94 mg/dL (ref 0.44–1.00)
GFR, Estimated: >60 mL/min (ref >60)
Glucose, Bld: 91 mg/dL (ref 70–99)
Potassium: 4 mmol/L (ref 3.5–5.1)
Sodium: 136 mmol/L (ref 135–145)
Total Bilirubin: 0.3 mg/dL (ref 0.3–1.2)
Total Protein: 8 g/dL (ref 6.5–8.1)

## 2023-05-27 LAB — CBC WITH DIFFERENTIAL/PLATELET
Abs Immature Granulocytes: 0.02 10*3/uL (ref 0.00–0.07)
Basophils Absolute: 0 10*3/uL (ref 0.0–0.1)
Basophils Relative: 1 %
Eosinophils Absolute: 0.1 10*3/uL (ref 0.0–0.5)
Eosinophils Relative: 1 %
HCT: 39.2 % (ref 36.0–46.0)
Hemoglobin: 12.8 g/dL (ref 12.0–15.0)
Immature Granulocytes: 0 %
Lymphocytes Relative: 46 %
Lymphs Abs: 3.5 10*3/uL (ref 0.7–4.0)
MCH: 29.6 pg (ref 26.0–34.0)
MCHC: 32.7 g/dL (ref 30.0–36.0)
MCV: 90.5 fL (ref 80.0–100.0)
Monocytes Absolute: 0.5 10*3/uL (ref 0.1–1.0)
Monocytes Relative: 6 %
Neutro Abs: 3.5 10*3/uL (ref 1.7–7.7)
Neutrophils Relative %: 46 %
Platelets: 241 10*3/uL (ref 150–400)
RBC: 4.33 MIL/uL (ref 3.87–5.11)
RDW: 12.2 % (ref 11.5–15.5)
WBC: 7.7 10*3/uL (ref 4.0–10.5)
nRBC: 0 % (ref 0.0–0.2)

## 2023-05-27 LAB — LIPASE, BLOOD: Lipase: 30 U/L (ref 11–51)

## 2023-05-27 LAB — HCG, QUANTITATIVE, PREGNANCY: hCG, Beta Chain, Quant, S: <1 m[IU]/mL (ref <5)

## 2023-05-27 LAB — URINALYSIS, ROUTINE W REFLEX MICROSCOPIC
Bilirubin Urine: NEGATIVE
Glucose, UA: NEGATIVE mg/dL
Hgb urine dipstick: NEGATIVE
Ketones, ur: NEGATIVE mg/dL
Leukocytes,Ua: NEGATIVE
Nitrite: NEGATIVE
Protein, ur: NEGATIVE mg/dL
Specific Gravity, Urine: 1.023 (ref 1.005–1.030)
pH: 5 (ref 5.0–8.0)

## 2023-05-27 MED ORDER — IOHEXOL 300 MG/ML  SOLN
100.0000 mL | Freq: Once | INTRAMUSCULAR | Status: AC | PRN
  Administered 2023-05-27: 100 mL via INTRAVENOUS

## 2023-05-27 MED ORDER — NAPROXEN 500 MG PO TABS: 500.0000 mg | ORAL_TABLET | Freq: Two times a day (BID) | ORAL | 0 refills | Status: AC

## 2023-05-27 MED ORDER — HYDROCODONE-ACETAMINOPHEN 5-325 MG PO TABS: 1.0000 | ORAL_TABLET | Freq: Four times a day (QID) | ORAL | 0 refills | Status: AC | PRN

## 2023-05-27 NOTE — ED Triage Notes (Signed)
Patient presents with 1.5 weeks of NV and 2 days of lower abdominal pain; She went to [facility] and was referred here for further evaluation; Pain worsens with palpation

## 2023-05-27 NOTE — ED Provider Notes (Signed)
[facility] Provider Note    Event Date/Time   First MD Initiated Contact with Patient 05/27/23 1925     (approximate)   History   Abdominal Pain (Patient presents with 1.5 weeks of NV and 2 days of lower abdominal pain; She went to UC and was referred here for further evaluation; Pain worsens with palpation)   HPI  Wendy Castro is a 27 y.o. female  anemia in pregnancy, ovarian cysts, and as listed in EMR presents to the emergency department for treatment and evaluation of right lower quadrant pain.  She was initially evaluated at urgent care and sent to the emergency department for further evaluation.  Patient denies fever.  She has had some intermittent nausea and vomiting for about a week and a half.  She has previously had pelvic pain with ruptured ovarian cyst but has not had any problems since insertion of her Implanon a couple years ago.  No abdominal surgical history.      Physical Exam   Triage Vital Signs: ED Triage Vitals  Encounter Vitals Group     BP 05/27/23 1723 112/79     Systolic BP Percentile --      Diastolic BP Percentile --      Pulse Rate 05/27/23 1723 69     Resp 05/27/23 1723 16     Temp 05/27/23 1723 98.9 F (37.2 C)     Temp Source 05/27/23 1723 Oral     SpO2 05/27/23 1723 100 %     Weight 05/27/23 1719 155 lb (70.3 kg)     Height 05/27/23 1719 5\' 4"  (1.626 m)     Head Circumference --      Peak Flow --      Pain Score 05/27/23 1718 5     Pain Loc --      Pain Education --      Exclude from Growth Chart --     Most recent vital signs: Vitals:   05/27/23 1723 05/27/23 2225  BP: 112/79 107/60  Pulse: 69 60  Resp: 16 18  Temp: 98.9 F (37.2 C)   SpO2: 100%     General: Awake, no distress.  CV:  Good peripheral perfusion.  Resp:  Normal effort.  Abd:  No distention.  Tenderness to palpation in the right lower quadrant.  No rebound tenderness.  Abdomen is soft. Other:     ED Results / Procedures / Treatments    Labs (all labs ordered are listed, but only abnormal results are displayed) Labs Reviewed  URINALYSIS, ROUTINE W REFLEX MICROSCOPIC - Abnormal; Notable for the following components:      Result Value   Color, Urine YELLOW (*)    APPearance CLEAR (*)    All other components within normal limits  COMPREHENSIVE METABOLIC PANEL  LIPASE, BLOOD  CBC WITH DIFFERENTIAL/PLATELET  HCG, QUANTITATIVE, PREGNANCY     EKG  Not indicated.   RADIOLOGY  Image and radiology report reviewed and interpreted by me. Radiology report consistent with the same.  CT of the abdomen pelvis with contrast shows right adnexal cyst measuring 3.1 x 2.2 but otherwise normal including appendix.  PROCEDURES:  Critical Care performed: No  Procedures   MEDICATIONS ORDERED IN ED:  Medications  iohexol (OMNIPAQUE) 300 MG/ML solution 100 mL (100 mLs Intravenous Contrast Given 05/27/23 2148)     IMPRESSION / MDM / ASSESSMENT AND PLAN / ED COURSE   I have reviewed the triage note.  Differential diagnosis includes, but is  not limited to, appendicitis, ovarian cyst, musculoskeletal pain, pregnancy  Patient's presentation is most consistent with acute presentation with potential threat to life or bodily function.  27 year old female presenting to the emergency department for treatment and evaluation of right lower quadrant pain.  See HPI for further details  On exam, she has pain in the general area of the right lower quadrant with palpation.  There is no rebound tenderness.  Vital signs are reassuring.  She is afebrile and not tachycardic.  Labs are reassuring including negative beta hCG.  CT of the abdomen and pelvis shows a right ovarian cyst.  This is likely the cause of her pain as she has experienced similar symptoms in the past.  Her pain is well-controlled at this time as she has had no nausea or vomiting today.  It is unlikely that she has an ovarian torsion.  Strict ER return precautions were  discussed and she will return for any increase in right side pain, persistent nausea, vomiting, fever.  She otherwise is to follow-up with primary care or gynecology.  Patient feels comfortable with this plan.  Pain medications and work excuse provided.      FINAL CLINICAL IMPRESSION(S) / ED DIAGNOSES   Final diagnoses:  Cyst of right ovary     Rx / DC Orders   ED Discharge Orders          Ordered    HYDROcodone-acetaminophen (NORCO/VICODIN) 5-325 MG tablet  Every 6 hours PRN        05/27/23 2221    naproxen (NAPROSYN) 500 MG tablet  2 times daily with meals        05/27/23 2221             Note:  This document was prepared using Dragon voice recognition software and may include unintentional dictation errors.   Chinita Pester, FNP 05/27/23 9629    Minna Antis, MD 05/28/23 1902

## 2023-05-27 NOTE — ED Provider Notes (Incomplete)
   [facility] Provider Note    Event Date/Time   First MD Initiated Contact with Patient 05/27/23 1925     (approximate)   History   Abdominal Pain (Patient presents with 1.5 weeks of NV and 2 days of lower abdominal pain; She went to UC and was referred here for further evaluation; Pain worsens with palpation)   HPI  Wendy Castro is a 27 y.o. female  anemia in pregnancy,  and as listed in EMR presents to the emergency department for ***.      Physical Exam   Triage Vital Signs: ED Triage Vitals  Encounter Vitals Group     BP 05/27/23 1723 112/79     Systolic BP Percentile --      Diastolic BP Percentile --      Pulse Rate 05/27/23 1723 69     Resp 05/27/23 1723 16     Temp 05/27/23 1723 98.9 F (37.2 C)     Temp Source 05/27/23 1723 Oral     SpO2 05/27/23 1723 100 %     Weight 05/27/23 1719 155 lb (70.3 kg)     Height 05/27/23 1719 5\' 4"  (1.626 m)     Head Circumference --      Peak Flow --      Pain Score 05/27/23 1718 5     Pain Loc --      Pain Education --      Exclude from Growth Chart --     Most recent vital signs: Vitals:   05/27/23 1723  BP: 112/79  Pulse: 69  Resp: 16  Temp: 98.9 F (37.2 C)  SpO2: 100%    General: Awake, no distress. *** CV:  Good peripheral perfusion. *** Resp:  Normal effort. *** Abd:  No distention. *** Other:  ***   ED Results / Procedures / Treatments   Labs (all labs ordered are listed, but only abnormal results are displayed) Labs Reviewed  URINALYSIS, ROUTINE W REFLEX MICROSCOPIC - Abnormal; Notable for the following components:      Result Value   Color, Urine YELLOW (*)    APPearance CLEAR (*)    All other components within normal limits  COMPREHENSIVE METABOLIC PANEL  LIPASE, BLOOD  CBC WITH DIFFERENTIAL/PLATELET  HCG, QUANTITATIVE, PREGNANCY     EKG  ***   RADIOLOGY  Image and radiology report reviewed and interpreted by me. Radiology report consistent with the  same.  ***  PROCEDURES:  Critical Care performed: {CriticalCareYesNo:19197::"Yes, see critical care procedure note(s)","No"}  Procedures   MEDICATIONS ORDERED IN ED:  Medications - No data to display   IMPRESSION / MDM / ASSESSMENT AND PLAN / ED COURSE   I have reviewed the triage note.  Differential diagnosis includes, but is not limited to, ***  Patient's presentation is most consistent with {EM COPA:27473}  {**The patient is on the cardiac monitor to evaluate for evidence of arrhythmia and/or significant heart rate changes.**}      FINAL CLINICAL IMPRESSION(S) / ED DIAGNOSES   Final diagnoses:  None     Rx / DC Orders   ED Discharge Orders     None        Note:  This document was prepared using Dragon voice recognition software and may include unintentional dictation errors.

## 2023-10-01 IMAGING — CT CT ANGIO CHEST
2 of 7 series · 19 of 46 positions shown · IV contrast (APPLIED)
Comparison: Chest radiograph dated 03/24/2022.

CLINICAL DATA: Positive D-dimer.  Concern for pulmonary embolism.

EXAM:
CT ANGIOGRAPHY CHEST WITH CONTRAST
TECHNIQUE: Multidetector CT imaging of the chest was performed using the
standard protocol during bolus administration of intravenous
contrast. Multiplanar CT image reconstructions and MIPs were
obtained to evaluate the vascular anatomy.

[Series 5: coronal mpr · coronal · 0.60mm/px · 3 of 74 slices shown]
[im 25/74  soft-tissue]
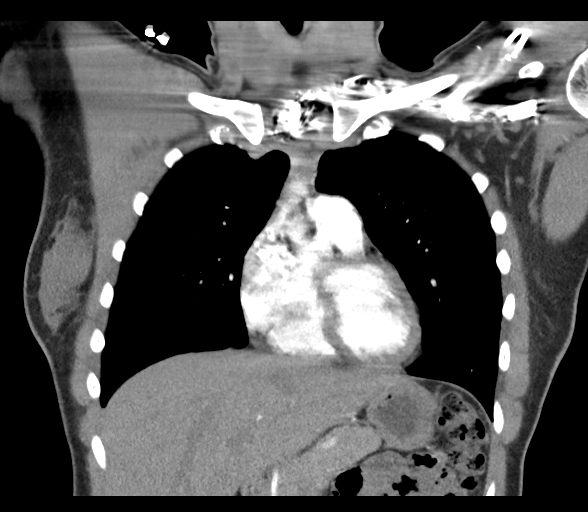
[im 33/74  soft-tissue]
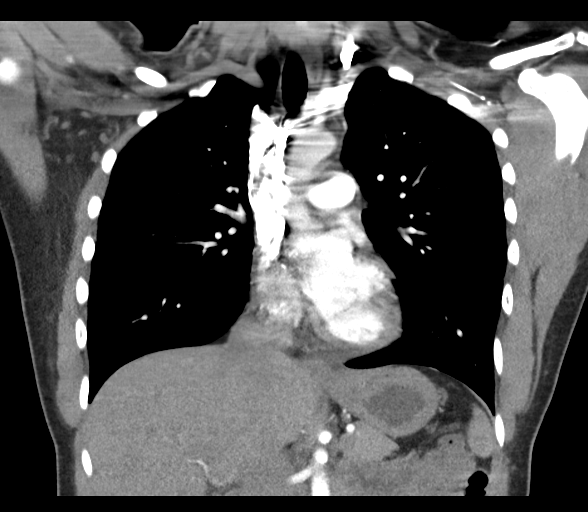
[im 41/74  soft-tissue]
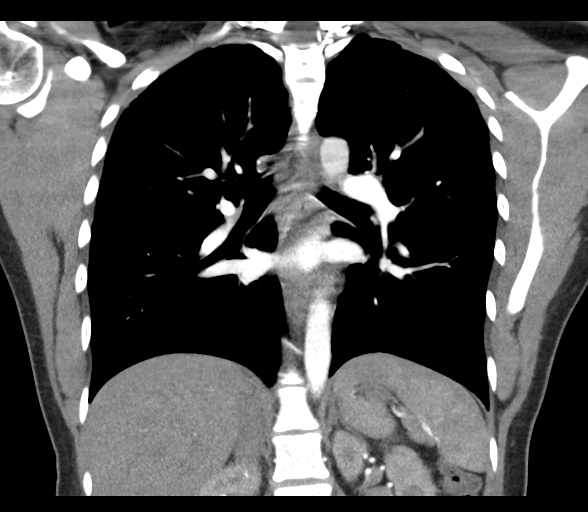

[Series 6: thins · axial · 0.68mm/px · z∈[-669,-412]mm · 16 of 357 slices shown]
[im 18/357  lung]
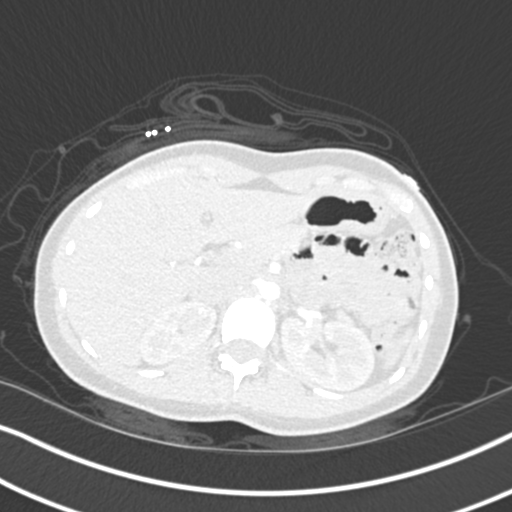
[im 36/357  soft-tissue]
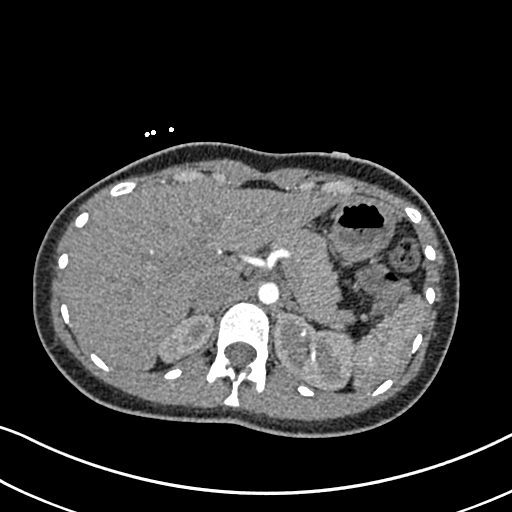
[im 54/357  lung]
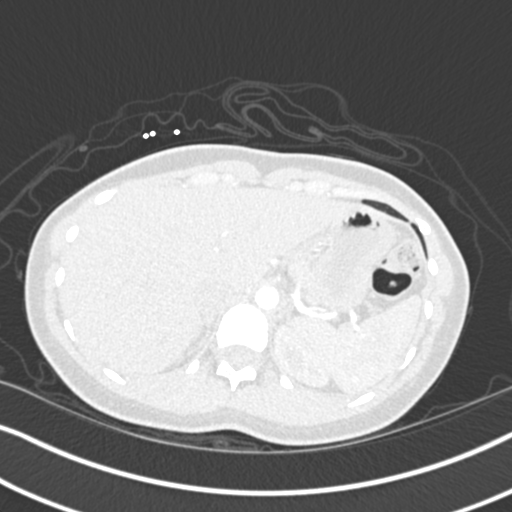
[im 90/357  soft-tissue]
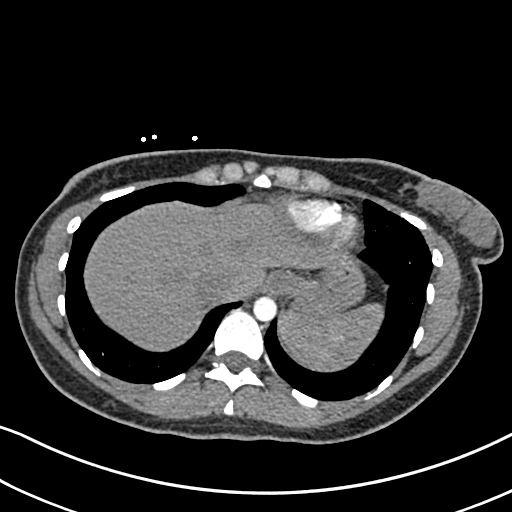
[im 107/357  lung]
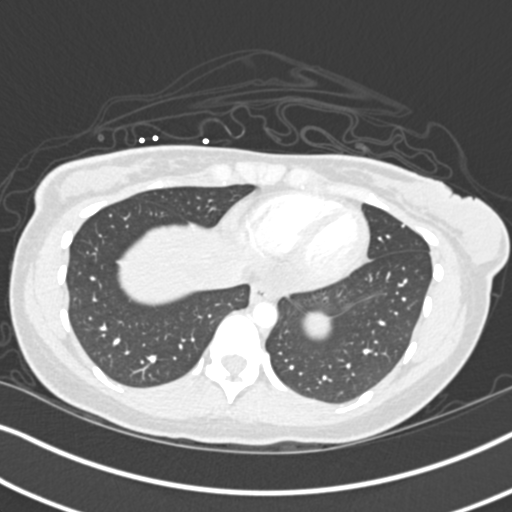
[im 125/357  soft-tissue]
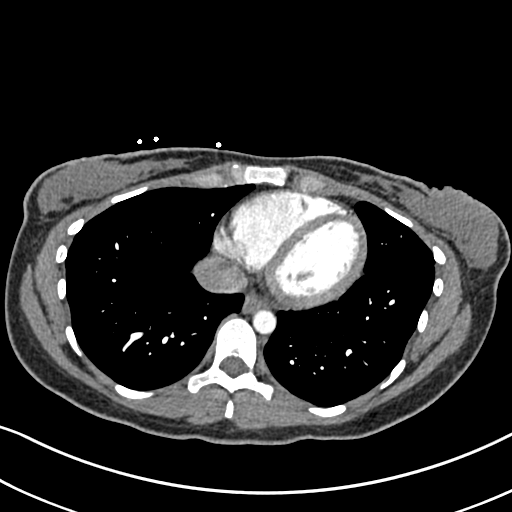
[im 143/357  lung]
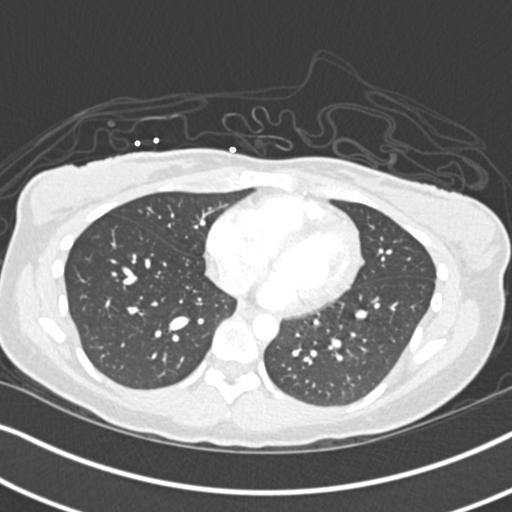
[im 161/357  soft-tissue]
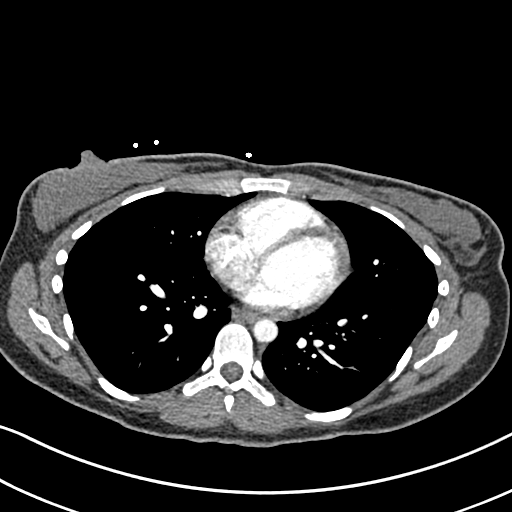
[im 196/357  lung]
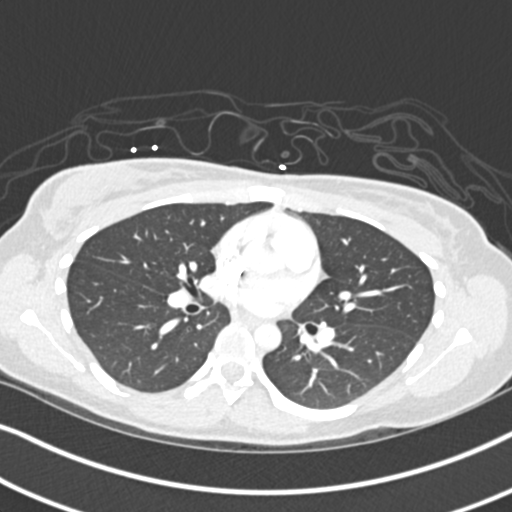
[im 214/357  soft-tissue]
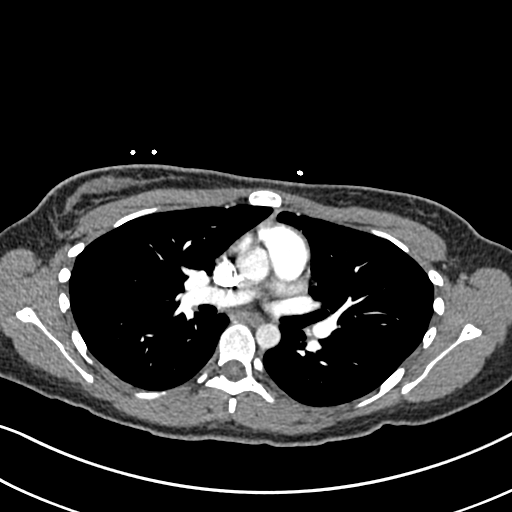
[im 232/357  lung]
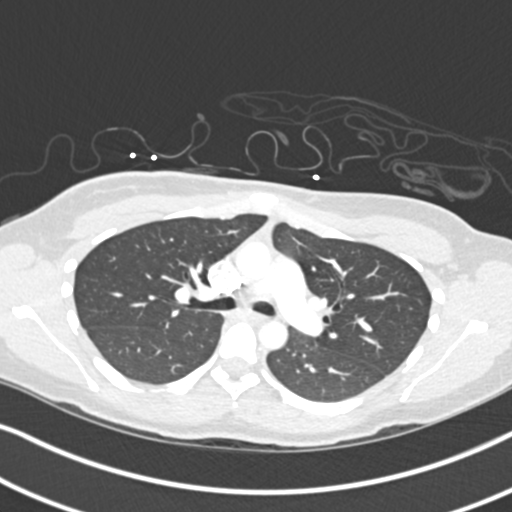
[im 250/357  soft-tissue]
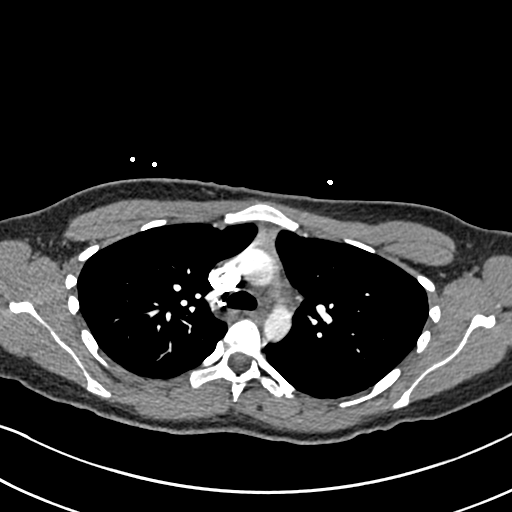
[im 268/357  lung]
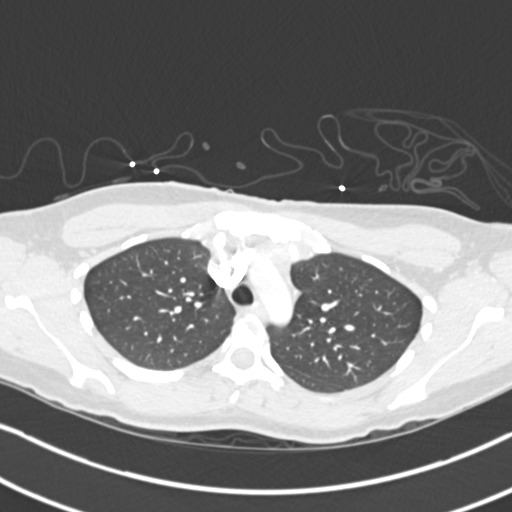
[im 303/357  soft-tissue]
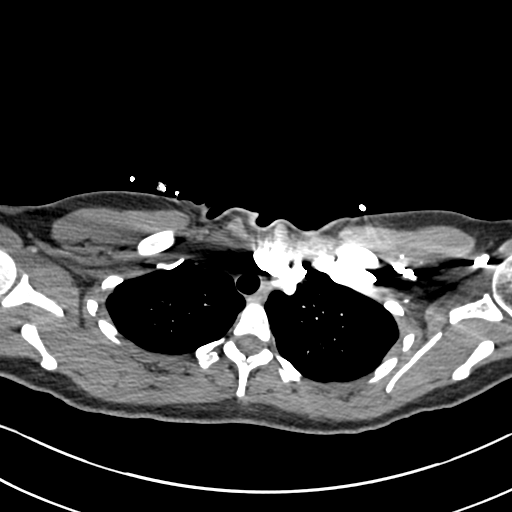
[im 321/357  lung]
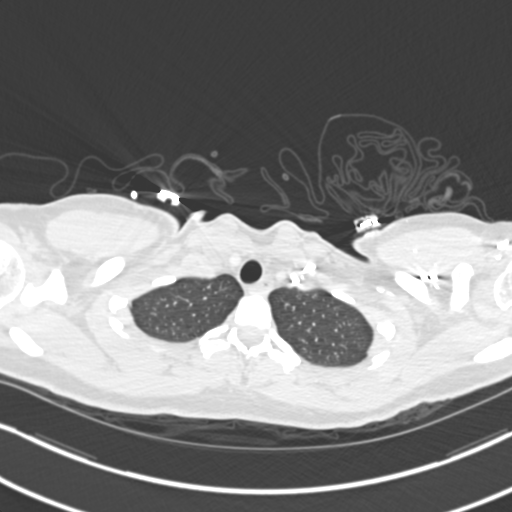
[im 339/357  soft-tissue]
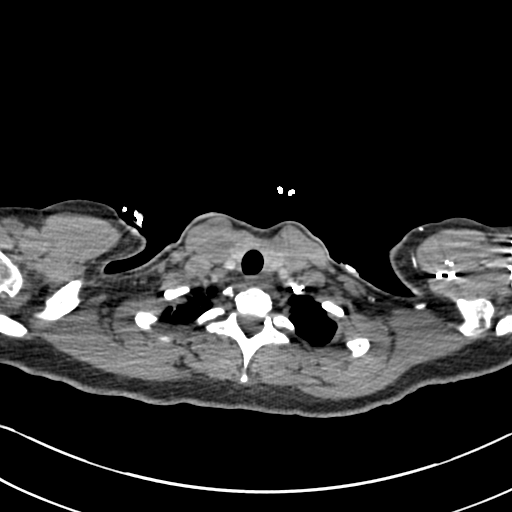

[19 of 46 positions shown; findings below may reference images not displayed]

RADIATION DOSE REDUCTION: This exam was performed according to the
departmental dose-optimization program which includes automated
exposure control, adjustment of the mA and/or kV according to
patient size and/or use of iterative reconstruction technique.

CONTRAST:  75mL OMNIPAQUE IOHEXOL 350 MG/ML SOLN
FINDINGS: Evaluation of this exam is limited due to respiratory motion
artifact.

Cardiovascular: There is no cardiomegaly or pericardial effusion.
The thoracic aorta is unremarkable. No pulmonary artery embolus
identified.

Mediastinum/Nodes: No hilar or mediastinal adenopathy. The esophagus
is grossly unremarkable. No mediastinal fluid collection.

Lungs/Pleura: The lungs are clear. There is no pleural effusion or
pneumothorax. The central airways are patent.

Upper Abdomen: No acute abnormality.

Musculoskeletal: No chest wall abnormality. No acute or significant
osseous findings.

Review of the MIP images confirms the above findings.
IMPRESSION: No acute intrathoracic pathology. No CT evidence of pulmonary
embolism.

## 2024-08-21 ENCOUNTER — Encounter (HOSPITAL_COMMUNITY): Payer: Self-pay

## 2024-08-21 ENCOUNTER — Emergency Department (HOSPITAL_COMMUNITY)
Admission: EM | Admit: 2024-08-21 | Discharge: 2024-08-22 | Disposition: A | Discharge status: Home / Self Care | Attending: Emergency Medicine | Admitting: Emergency Medicine

## 2024-08-21 ENCOUNTER — Other Ambulatory Visit: Payer: Self-pay

## 2024-08-21 ENCOUNTER — Emergency Department (HOSPITAL_COMMUNITY)

## 2024-08-21 DIAGNOSIS — R002 Palpitations: Secondary | ICD-10-CM | POA: Insufficient documentation

## 2024-08-21 DIAGNOSIS — R000 Tachycardia, unspecified: Secondary | ICD-10-CM | POA: Insufficient documentation

## 2024-08-21 DIAGNOSIS — R0602 Shortness of breath: Secondary | ICD-10-CM | POA: Insufficient documentation

## 2024-08-21 DIAGNOSIS — R079 Chest pain, unspecified: Secondary | ICD-10-CM | POA: Insufficient documentation

## 2024-08-21 LAB — CBC WITH DIFFERENTIAL/PLATELET
Abs Immature Granulocytes: 0.01 K/uL (ref 0.00–0.07)
Basophils Absolute: 0.1 K/uL (ref 0.0–0.1)
Basophils Relative: 1 %
Eosinophils Absolute: 0.1 K/uL (ref 0.0–0.5)
Eosinophils Relative: 1 %
HCT: 39.6 % (ref 36.0–46.0)
Hemoglobin: 12.8 g/dL (ref 12.0–15.0)
Immature Granulocytes: 0 %
Lymphocytes Relative: 49 %
Lymphs Abs: 3.3 K/uL (ref 0.7–4.0)
MCH: 29.8 pg (ref 26.0–34.0)
MCHC: 32.3 g/dL (ref 30.0–36.0)
MCV: 92.1 fL (ref 80.0–100.0)
Monocytes Absolute: 0.5 K/uL (ref 0.1–1.0)
Monocytes Relative: 8 %
Neutro Abs: 2.8 K/uL (ref 1.7–7.7)
Neutrophils Relative %: 41 %
Platelets: 263 K/uL (ref 150–400)
RBC: 4.3 MIL/uL (ref 3.87–5.11)
RDW: 12.4 % (ref 11.5–15.5)
WBC: 6.7 K/uL (ref 4.0–10.5)
nRBC: 0 % (ref 0.0–0.2)

## 2024-08-21 LAB — COMPREHENSIVE METABOLIC PANEL WITH GFR
ALT: 18 U/L (ref 0–44)
AST: 17 U/L (ref 15–41)
Albumin: 3.8 g/dL (ref 3.5–5.0)
Alkaline Phosphatase: 62 U/L (ref 38–126)
Anion gap: 9 (ref 5–15)
BUN: 11 mg/dL (ref 6–20)
CO2: 24 mmol/L (ref 22–32)
Calcium: 9 mg/dL (ref 8.9–10.3)
Chloride: 106 mmol/L (ref 98–111)
Creatinine, Ser: 1.06 mg/dL — ABNORMAL HIGH (ref 0.44–1.00)
GFR, Estimated: >60 mL/min (ref >60)
Glucose, Bld: 85 mg/dL (ref 70–99)
Potassium: 3.5 mmol/L (ref 3.5–5.1)
Sodium: 139 mmol/L (ref 135–145)
Total Bilirubin: 0.4 mg/dL (ref 0.0–1.2)
Total Protein: 7 g/dL (ref 6.5–8.1)

## 2024-08-21 LAB — MAGNESIUM: Magnesium: 2 mg/dL (ref 1.7–2.4)

## 2024-08-21 LAB — HCG, SERUM, QUALITATIVE: Preg, Serum: NEGATIVE

## 2024-08-21 LAB — TROPONIN I (HIGH SENSITIVITY): Troponin I (High Sensitivity): <2 ng/L (ref <18)

## 2024-08-21 NOTE — ED Provider Triage Note (Signed)
 Emergency Medicine Provider Triage Evaluation Note  Wendy Castro , a 28 y.o. female  was evaluated in triage.  Pt complains of palpitations today with chest tightness and shortness of breath. She thought is was a panic attack and tried Gabapentin, did not help. She reports she thinks Vyvanse might be making it worse.   Review of Systems  Positive: Cp Negative: DVT SX  Physical Exam  BP 105/69 (BP Location: Right Arm)   Pulse 86   Temp 98.7 F (37.1 C)   Resp 17   SpO2 100%  Gen:   Awake, no distress   Resp:  Normal effort  MSK:   Moves extremities without difficulty  Other:    Medical Decision Making  Medically screening exam initiated at 8:59 PM.  Appropriate orders placed.  Wendy Castro was informed that the remainder of the evaluation will be completed by another provider, this initial triage assessment does not replace that evaluation, and the importance of remaining in the ED until their evaluation is complete.     Shermon Warren SAILOR, PA-C 08/21/24 2101

## 2024-08-21 NOTE — ED Triage Notes (Signed)
 Patient states her heart has been racing all day. She has been having chest pain and shob.She states that the chest pain is in the center and it feels like tightness. Shob is intermittent. Denies cardiac history.

## 2024-08-22 LAB — TROPONIN I (HIGH SENSITIVITY): Troponin I (High Sensitivity): <2 ng/L (ref <18)

## 2024-08-22 NOTE — Discharge Instructions (Signed)
 We recommend discontinuing Vyvanse as this is likely contributing to your symptoms.  Follow-up with your primary care doctor.  You may return for new or concerning symptoms.

## 2024-08-22 NOTE — ED Provider Notes (Signed)
 [place] EMERGENCY DEPARTMENT AT [facility] Provider Note   CSN: [id number] Arrival date & time: 08/21/24  2012     Patient presents with: Tachycardia, Chest Pain, and Shortness of Breath   Wendy Castro is a 28 y.o. female.   28 year old female with a history of anxiety presents to the emergency department for evaluation of palpitations.  She reports palpitations characterized as a rapid heartbeat.  She notes some tightness in her chest which was aggravated by breathing.  She tried gabapentin for symptoms without relief.  She has recently been taking Vyvanse.  Denies any other stimulant use.  No fevers, syncope, vomiting, or hx of cardiac issues.  The history is provided by the patient. No language interpreter was used.  Chest Pain Associated symptoms: shortness of breath   Shortness of Breath Associated symptoms: chest pain        Prior to Admission medications   Medication Sig Start Date End Date Taking? Authorizing Provider  ferrous sulfate  325 (65 FE) MG EC tablet Take 325 mg by mouth 3 (three) times daily with meals.    [provider]  naproxen  (NAPROSYN ) 500 MG tablet Take 1 tablet (500 mg total) by mouth 2 (two) times daily with a meal. 05/27/23   Triplett, Cari B, FNP  nitrofurantoin , macrocrystal-monohydrate, (MACROBID ) 100 MG capsule Take 1 capsule (100 mg total) by mouth 2 (two) times daily. 1 po BId Patient not taking: Reported on 03/24/2022 04/07/20   Gladis Mustard, FNP  Prenatal Vit-Fe Fumarate-FA (MULTIVITAMIN-PRENATAL) 27-0.8 MG TABS tablet Take 1 tablet by mouth daily at 12 noon.    [provider]  sertraline  (ZOLOFT ) 50 MG tablet TAKE 1 TABLET BY MOUTH EVERY DAY Patient not taking: Reported on 03/24/2022 12/25/19   Sebastian Sham, CNM    Allergies: Amoxicillin    Review of Systems  Respiratory:  Positive for shortness of breath.   Cardiovascular:  Positive for chest pain.  Ten systems reviewed and are negative for acute  change, except as noted in the HPI.    Updated Vital Signs BP 107/69 (BP Location: Right Arm)   Pulse 72   Temp 98.3 F (36.8 C)   Resp 17   Ht 5' 4 (1.626 m)   Wt 70.3 kg   SpO2 100%   BMI 26.60 kg/m   Physical Exam Vitals and nursing note reviewed.  Constitutional:      General: She is not in acute distress.    Appearance: She is well-developed. She is not diaphoretic.     Comments: Nontoxic appearing and in NAD  HENT:     Head: Normocephalic and atraumatic.  Eyes:     General: No scleral icterus.    Conjunctiva/sclera: Conjunctivae normal.  Cardiovascular:     Rate and Rhythm: Normal rate and regular rhythm.     Pulses: Normal pulses.  Pulmonary:     Effort: Pulmonary effort is normal. No respiratory distress.     Breath sounds: No stridor. No wheezing.     Comments: Respirations even and unlabored. Lungs CTAB. Musculoskeletal:        General: Normal range of motion.     Cervical back: Normal range of motion.  Skin:    General: Skin is warm and dry.     Coloration: Skin is not pale.     Findings: No erythema or rash.  Neurological:     Mental Status: She is alert and oriented to person, place, and time.     Coordination: Coordination normal.  Psychiatric:        Behavior: Behavior normal.     (all labs ordered are listed, but only abnormal results are displayed) Labs Reviewed  COMPREHENSIVE METABOLIC PANEL WITH GFR - Abnormal; Notable for the following components:      Result Value   Creatinine, Ser 1.06 (*)    All other components within normal limits  MAGNESIUM  CBC WITH DIFFERENTIAL/PLATELET  HCG, SERUM, QUALITATIVE  TROPONIN I (HIGH SENSITIVITY)  TROPONIN I (HIGH SENSITIVITY)    EKG: None  Radiology: DG Chest 2 View Result Date: 08/21/2024 EXAM: 2 VIEW(S) XRAY OF THE CHEST 08/21/2024 09:49:00 PM COMPARISON: Chest x-ray 03/25/1999, chest x-ray 03/24/2022, and chest CT 03/24/2012. CLINICAL HISTORY: Palpitations. Encounter for palpitations and  shortness of breath. FINDINGS: LUNGS AND PLEURA: No focal pulmonary opacity. No pulmonary edema. No pleural effusion. No pneumothorax. HEART AND MEDIASTINUM: No acute abnormality of the cardiac and mediastinal silhouettes. BONES AND SOFT TISSUES: No acute osseous abnormality. IMPRESSION: 1. No acute cardiopulmonary disease detected. Electronically signed by: Greig Pique MD 08/21/2024 10:00 PM EDT RP Workstation: [id number]     Procedures   Medications Ordered in the ED - No data to display                                  Medical Decision Making  This patient presents to the ED for concern of palpitations, this involves an extensive number of treatment options, and is a complaint that carries with it a high risk of complications and morbidity.  The differential diagnosis includes arrhythmia vs hyperthyroid vs anxiety vs illicit drug use/ingestion vs dehydration   Co morbidities that complicate the patient evaluation  Anxiety    Additional history obtained:  External records from outside source obtained and reviewed including PCP visit in June for palpitations   Lab Tests:  I Ordered, and personally interpreted labs.  The pertinent results include:  Creatinine 1.06. Labs otherwise normal.   Imaging Studies ordered:  I ordered imaging studies including CXR  I independently visualized and interpreted imaging which showed no acute cardiopulmonary abnormality I agree with the radiologist interpretation   Cardiac Monitoring:  The patient was maintained on a cardiac monitor.  I personally viewed and interpreted the cardiac monitored which showed an underlying rhythm of: NSR   Medicines ordered and prescription drug management:  I have reviewed the patients home medicines and have made adjustments as needed   Test Considered:  TSH   Problem List / ED Course:  Suspect anxiety attack provoked by Vyvanse use.  Symptoms have spontaneously been improving.  She does not have  any persistent tachycardia.  EKG without concerning tachyarrhythmia or ischemic change.  Troponin negative during ED workup. Labs, otherwise, reassuring.  Symptoms spontaneously improving, per patient. Do not feel further emergent workup is presently indicated.  Have advised patient to discontinue her Vyvanse and follow-up with her provider.   Reevaluation:  After the interventions noted above, I reevaluated the patient and found that they have :improved   Social Determinants of Health:  Lives independently   Dispostion:  After consideration of the diagnostic results and the patients response to treatment, I feel that the patent would benefit from outpatient f/u with PCP. Could consider Holter monitor in future if palpitations persist despite discontinuation of Vyvanse. Return precautions discussed and provided. Patient discharged in stable condition with no unaddressed concerns.       Final diagnoses:  Palpitations  ED Discharge Orders     None          Keith Sor, PA-C 08/22/24 [id number]    Haze Lonni PARAS, MD 08/22/24 [phone]

## 2024-08-22 NOTE — ED Notes (Signed)
 Discharge instructions reviewed.   Opportunity for questions and concerns provided.   Alert, oriented and ambulatory.   Displays no signs of distress.   Declined discharge vital signs.

## 2024-11-28 ENCOUNTER — Ambulatory Visit: Payer: Self-pay
